# Patient Record
Sex: Male | Born: 2000 | Race: White | Hispanic: No | Marital: Single | Smoking: Current every day smoker
Health system: Southern US, Community
[De-identification: ages and names within clinical notes are randomized; demographics above are authoritative.]

## PROBLEM LIST (undated history)

## (undated) HISTORY — PX: CIRCUMCISION: SUR203

## (undated) HISTORY — PX: CARDIAC SURGERY: SHX584

---
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
## 2020-02-21 DIAGNOSIS — Z419 Encounter for procedure for purposes other than remedying health state, unspecified: Secondary | ICD-10-CM | POA: Diagnosis not present

## 2020-03-23 DIAGNOSIS — Z419 Encounter for procedure for purposes other than remedying health state, unspecified: Secondary | ICD-10-CM | POA: Diagnosis not present

## 2020-04-20 DIAGNOSIS — Z419 Encounter for procedure for purposes other than remedying health state, unspecified: Secondary | ICD-10-CM | POA: Diagnosis not present

## 2020-05-21 DIAGNOSIS — Z419 Encounter for procedure for purposes other than remedying health state, unspecified: Secondary | ICD-10-CM | POA: Diagnosis not present

## 2020-06-20 DIAGNOSIS — Z419 Encounter for procedure for purposes other than remedying health state, unspecified: Secondary | ICD-10-CM | POA: Diagnosis not present

## 2020-06-30 DIAGNOSIS — Z8781 Personal history of (healed) traumatic fracture: Secondary | ICD-10-CM | POA: Diagnosis not present

## 2020-06-30 DIAGNOSIS — M25521 Pain in right elbow: Secondary | ICD-10-CM | POA: Diagnosis not present

## 2020-06-30 DIAGNOSIS — R030 Elevated blood-pressure reading, without diagnosis of hypertension: Secondary | ICD-10-CM | POA: Diagnosis not present

## 2020-06-30 DIAGNOSIS — M25421 Effusion, right elbow: Secondary | ICD-10-CM | POA: Diagnosis not present

## 2020-07-05 DIAGNOSIS — M25421 Effusion, right elbow: Secondary | ICD-10-CM | POA: Diagnosis not present

## 2020-07-05 DIAGNOSIS — M25521 Pain in right elbow: Secondary | ICD-10-CM | POA: Diagnosis not present

## 2020-07-21 DIAGNOSIS — Z419 Encounter for procedure for purposes other than remedying health state, unspecified: Secondary | ICD-10-CM | POA: Diagnosis not present

## 2020-08-20 DIAGNOSIS — Z419 Encounter for procedure for purposes other than remedying health state, unspecified: Secondary | ICD-10-CM | POA: Diagnosis not present

## 2020-09-20 DIAGNOSIS — Z419 Encounter for procedure for purposes other than remedying health state, unspecified: Secondary | ICD-10-CM | POA: Diagnosis not present

## 2020-10-21 DIAGNOSIS — Z419 Encounter for procedure for purposes other than remedying health state, unspecified: Secondary | ICD-10-CM | POA: Diagnosis not present

## 2020-11-20 DIAGNOSIS — Z419 Encounter for procedure for purposes other than remedying health state, unspecified: Secondary | ICD-10-CM | POA: Diagnosis not present

## 2020-12-21 DIAGNOSIS — Z419 Encounter for procedure for purposes other than remedying health state, unspecified: Secondary | ICD-10-CM | POA: Diagnosis not present

## 2021-01-20 DIAGNOSIS — Z419 Encounter for procedure for purposes other than remedying health state, unspecified: Secondary | ICD-10-CM | POA: Diagnosis not present

## 2021-02-17 DIAGNOSIS — T148XXA Other injury of unspecified body region, initial encounter: Secondary | ICD-10-CM | POA: Diagnosis not present

## 2021-02-17 DIAGNOSIS — I959 Hypotension, unspecified: Secondary | ICD-10-CM | POA: Diagnosis not present

## 2021-02-17 DIAGNOSIS — M79673 Pain in unspecified foot: Secondary | ICD-10-CM | POA: Diagnosis not present

## 2021-02-17 DIAGNOSIS — F17290 Nicotine dependence, other tobacco product, uncomplicated: Secondary | ICD-10-CM | POA: Diagnosis not present

## 2021-02-17 DIAGNOSIS — M13871 Other specified arthritis, right ankle and foot: Secondary | ICD-10-CM | POA: Diagnosis not present

## 2021-02-17 DIAGNOSIS — M25571 Pain in right ankle and joints of right foot: Secondary | ICD-10-CM | POA: Diagnosis not present

## 2021-02-17 DIAGNOSIS — M064 Inflammatory polyarthropathy: Secondary | ICD-10-CM | POA: Diagnosis not present

## 2021-02-17 DIAGNOSIS — Z743 Need for continuous supervision: Secondary | ICD-10-CM | POA: Diagnosis not present

## 2021-02-20 DIAGNOSIS — Z419 Encounter for procedure for purposes other than remedying health state, unspecified: Secondary | ICD-10-CM | POA: Diagnosis not present

## 2021-03-23 DIAGNOSIS — Z419 Encounter for procedure for purposes other than remedying health state, unspecified: Secondary | ICD-10-CM | POA: Diagnosis not present

## 2021-04-20 DIAGNOSIS — Z419 Encounter for procedure for purposes other than remedying health state, unspecified: Secondary | ICD-10-CM | POA: Diagnosis not present

## 2021-05-21 DIAGNOSIS — Z419 Encounter for procedure for purposes other than remedying health state, unspecified: Secondary | ICD-10-CM | POA: Diagnosis not present

## 2021-06-20 DIAGNOSIS — Z419 Encounter for procedure for purposes other than remedying health state, unspecified: Secondary | ICD-10-CM | POA: Diagnosis not present

## 2021-08-04 ENCOUNTER — Other Ambulatory Visit: Payer: Self-pay

## 2021-08-04 ENCOUNTER — Encounter (HOSPITAL_COMMUNITY): Payer: Self-pay

## 2021-08-04 ENCOUNTER — Emergency Department (HOSPITAL_COMMUNITY): Payer: Medicaid Other

## 2021-08-04 ENCOUNTER — Emergency Department (HOSPITAL_COMMUNITY)
Admission: EM | Admit: 2021-08-04 | Discharge: 2021-08-04 | Disposition: A | Discharge status: Home / Self Care | Payer: Medicaid Other | Attending: Emergency Medicine | Admitting: Emergency Medicine

## 2021-08-04 DIAGNOSIS — M25421 Effusion, right elbow: Secondary | ICD-10-CM | POA: Diagnosis not present

## 2021-08-04 DIAGNOSIS — M79601 Pain in right arm: Secondary | ICD-10-CM | POA: Diagnosis not present

## 2021-08-04 DIAGNOSIS — M25521 Pain in right elbow: Secondary | ICD-10-CM | POA: Diagnosis not present

## 2021-08-04 MED ORDER — DICLOFENAC SODIUM 50 MG PO TBEC: 50.0000 mg | DELAYED_RELEASE_TABLET | Freq: Two times a day (BID) | ORAL | 0 refills | Status: AC

## 2021-08-04 NOTE — ED Provider Triage Note (Signed)
Emergency Medicine Provider Triage Evaluation Note  Justin Duran , a 21 y.o. male  was evaluated in triage.  Pt complains of R elbow pain. 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 He heard a pop in the elbow and is now having sever pain and difficulty extending the elbow without severe pain.  Review of Systems  Positive: R elbow pain  Negative: Redness or heat  Physical Exam  BP (!) 140/96 (BP Location: Left Arm)   Pulse 91   Temp 99.3 F (37.4 C) (Oral)   Resp 18   Ht 5\' 9"  (1.753 m)   Wt 104.3 kg   SpO2 93%   BMI 33.97 kg/m  Gen:   Awake, no distress  Resp:  Normal effort  MSK:   Moves extremities without difficulty  Other:    Medical Decision Making  Medically screening exam initiated at 3:19 AM.  Appropriate orders placed.  Justin Duran was informed that the remainder of the evaluation will be completed by another provider, this initial triage assessment does not replace that evaluation, and the importance of remaining in the ED until their evaluation is complete.  Work up Justin Duran, [place], [place] 08/04/21 [phone]
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
## 2021-08-04 NOTE — ED Provider Notes (Signed)
[facility] EMERGENCY DEPARTMENT Provider Note   CSN: [id number] Arrival date & time: 08/04/21  4854     History  Chief Complaint  Patient presents with   Arm Injury    Justin Duran is a 21 y.o. male.  21 year old male presents with complaint of right elbow pain.  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 Patient states that approximate in 2020 in which case his elbow was stuck in a flexed position for period of time for unknown reasons.  At baseline, has full range of motion of the elbow.       Home Medications Prior to Admission medications   Medication Sig Start Date End Date Taking? Authorizing Provider  diclofenac (VOLTAREN) 50 MG EC tablet Take 1 tablet (50 mg total) by mouth 2 (two) times daily for 10 days. 08/04/21 08/14/21 Yes Jeannie Fend, PA-C      Allergies    Patient has no known allergies.    Review of Systems   Review of Systems Negative except as per HPI Physical Exam Updated Vital Signs BP 134/79 (BP Location: Left Arm)   Pulse 66   Temp (!) 97.5 F (36.4 C) (Oral)   Resp 18   Ht 5\' 9"  (1.753 m)   Wt 104.3 kg   SpO2 99%   BMI 33.97 kg/m  Physical Exam Vitals and nursing note reviewed.  Constitutional:      General: He is not in acute distress.    Appearance: He is well-developed. He is not diaphoretic.  HENT:     Head: Normocephalic and atraumatic.  Cardiovascular:     Pulses: Normal pulses.  Pulmonary:     Effort: Pulmonary effort is normal.  Musculoskeletal:        General: Swelling and tenderness present. No deformity.     Comments: Right elbow held in flexion at 90 degrees, unable to supinate/pronate/further flex or extend the elbow.  Does appear to have some swelling of the right elbow, generalized tenderness, no overlying  erythema  Skin:    General: Skin is warm and dry.     Findings: No bruising, erythema or rash.  Neurological:     Mental Status: He is alert and oriented to person, place, and time.     Sensory: No sensory deficit.  Psychiatric:        Behavior: Behavior normal.     ED Results / Procedures / Treatments   Labs (all labs ordered are listed, but only abnormal results are displayed) Labs Reviewed - No data to display  EKG None  Radiology DG Elbow Complete Right  Result Date: 08/04/2021 CLINICAL DATA:  Arm pain EXAM: RIGHT ELBOW - COMPLETE 3+ VIEW COMPARISON:  None FINDINGS: Right elbow joint effusion. No visible fracture. No subluxation or dislocation. Soft tissues are intact. IMPRESSION: Right elbow joint effusion without visible fracture. Cannot exclude occult fracture. Consider immobilization and repeat imaging in 1 week if symptoms persist. Electronically Signed   By: 08/06/2021 M.D.   On: 08/04/2021 03:46    Procedures Procedures    Medications Ordered in ED Medications - No data to display  ED Course/ Medical Decision Making/ A&P  Medical Decision Making Risk Prescription drug management.   21 year old male with complaint of pain of the right elbow as above.  He is found to have generalized right elbow tenderness with mild swelling.  Doubt septic joint, no overlying erythema, no fever.  Injuries reported to have happened after 4 hours of repetitive use of the right elbow resulting in a pop sensation.  Consider tendon injury.  X-ray with effusion, possibility of occult fracture.  Patient is placed in a sling, recommend 10-day course of NSAID and follow-up with orthopedics.  Recommend patient follow-up with Worker's Comp. as this injury occurred while he was at work.  Patient states his employer does not intend to file Worker's Comp.        Final Clinical Impression(s) / ED Diagnoses Final diagnoses:  Elbow effusion, right    Rx / DC  Orders ED Discharge Orders          Ordered    diclofenac (VOLTAREN) 50 MG EC tablet  2 times daily        08/04/21 0856              Jeannie Fend, PA-C 08/04/21 1008    Arby Barrette, MD 08/04/21 1040

## 2021-08-04 NOTE — Discharge Instructions (Signed)
Wear sling.  Complete course of NSAIDs. Follow up with orthopedics. Recommend discussing work related injury with your employer.

## 2021-09-13 ENCOUNTER — Emergency Department: Payer: Medicaid Other

## 2021-09-13 ENCOUNTER — Other Ambulatory Visit: Payer: Self-pay

## 2021-09-13 ENCOUNTER — Emergency Department
Admission: EM | Admit: 2021-09-13 | Discharge: 2021-09-13 | Disposition: A | Discharge status: Home / Self Care | Payer: Medicaid Other | Attending: Student in an Organized Health Care Education/Training Program | Admitting: Student in an Organized Health Care Education/Training Program

## 2021-09-13 DIAGNOSIS — S92411D Displaced fracture of proximal phalanx of right great toe, subsequent encounter for fracture with routine healing: Secondary | ICD-10-CM | POA: Diagnosis not present

## 2021-09-13 DIAGNOSIS — M79671 Pain in right foot: Secondary | ICD-10-CM | POA: Insufficient documentation

## 2021-09-13 MED ORDER — OXYCODONE-ACETAMINOPHEN 5-325 MG PO TABS
1.0000 | ORAL_TABLET | Freq: Once | ORAL | Status: AC
  Administered 2021-09-13: 1 via ORAL
  Filled 2021-09-13: qty 1

## 2021-09-13 MED ORDER — OXYCODONE-ACETAMINOPHEN 5-325 MG PO TABS: 1.0000 | ORAL_TABLET | ORAL | 0 refills | Status: DC | PRN

## 2021-09-13 NOTE — ED Provider Notes (Signed)
[facility] Provider Note    Event Date/Time   First MD Initiated Contact with Patient 09/13/21 1052     (approximate)   History   Foot Injury   HPI  Justin Duran is a 21 y.o. male presents to the ER for evaluation of right ankle pain.  Has a chronic history of this with tight Achilles tendon that was recommended for surgery as a child that he never had and also has pes cavus deformity of the right foot.  Is having worsening pain after starting new job where he is on his feet quite a bit.  Denies any numbness or tingling no fevers.  Has been taking anti-inflammatories without improvement.     Physical Exam   Triage Vital Signs: ED Triage Vitals  Enc Vitals Group     BP 09/13/21 1041 132/82     Pulse Rate 09/13/21 1041 83     Resp 09/13/21 1040 20     Temp 09/13/21 1040 98.3 F (36.8 C)     Temp Source 09/13/21 1040 Oral     SpO2 09/13/21 1041 98 %     Weight 09/13/21 1040 240 lb (108.9 kg)     Height 09/13/21 1040 5\' 11"  (1.803 m)     Head Circumference --      Peak Flow --      Pain Score 09/13/21 1040 6     Pain Loc --      Pain Edu? --      Excl. in GC? --     Most recent vital signs: Vitals:   09/13/21 1040 09/13/21 1041  BP:  132/82  Pulse:  83  Resp: 20   Temp: 98.3 F (36.8 C)   SpO2:  98%     Constitutional: Alert  Eyes: Conjunctivae are normal.  Head: Atraumatic. Nose: No congestion/rhinnorhea. Mouth/Throat: Mucous membranes are moist.   Neck: Painless ROM.  Cardiovascular:   Good peripheral circulation. Respiratory: Normal respiratory effort.  No retractions.  Gastrointestinal: Soft and nontender.  Musculoskeletal: High arch with tenderness to palpation of the base of the Achilles tendon.  Calf compartment soft no edema neurovascular intact no joint effusion no overlying warmth or cellulitis Neurologic:  MAE spontaneously. No gross focal neurologic deficits are appreciated.  Skin:  Skin is warm, dry and intact.  No rash noted. Psychiatric: Mood and affect are normal. Speech and behavior are normal.    ED Results / Procedures / Treatments   Labs (all labs ordered are listed, but only abnormal results are displayed) Labs Reviewed - No data to display   EKG     RADIOLOGY Please see ED Course for my review and interpretation.  I personally reviewed all radiographic images ordered to evaluate for the above acute complaints and reviewed radiology reports and findings.  These findings were personally discussed with the patient.  Please see medical record for radiology report.     Procedures   MEDICATIONS ORDERED IN ED: Medications  oxyCODONE-acetaminophen (PERCOCET/ROXICET) 5-325 MG per tablet 1 tablet (1 tablet Oral Given 09/13/21 1115)     IMPRESSION / MDM / ASSESSMENT AND PLAN / ED COURSE  I reviewed the triage vital signs and the nursing notes.                              Differential diagnosis includes, but is not limited to, fracture, contusion, tendinitis,  This patient presented to the ER  for evaluation of right ankle pain as described above.  Seems consistent with Achilles tendinitis but given duration and severity of pain will give pain medication will order x-ray.  Patient has crutches at home as well as brace for similar episode in the past.  He has follow-up with podiatry this coming week.   Clinical Course as of 09/13/21 1153  Tue Sep 13, 2021  1127 X-ray on my review and interpretation does not show any evidence of fracture or lytic lesion. [PR]    Clinical Course User Index [PR] Willy Eddy, MD   X-ray with no acute deformity.  Pain improved with oral medication.  On review of PMP will give short prescription of pain medication.  Is not consistent with infectious process.  Not consistent with DVT.  Does appear appropriate for outpatient follow-up.   FINAL CLINICAL IMPRESSION(S) / ED DIAGNOSES   Final diagnoses:  Right foot pain     Rx / DC Orders    ED Discharge Orders          Ordered    oxyCODONE-acetaminophen (PERCOCET) 5-325 MG tablet  Every 4 hours PRN        09/13/21 1149             Note:  This document was prepared using Dragon voice recognition software and may include unintentional dictation errors.    Willy Eddy, MD 09/13/21 1153

## 2021-09-13 NOTE — ED Triage Notes (Signed)
Pt here with right foot pain. Pt states his achilles tendon is tight all the time but the pain has gotten worse over time. Pt states he was born with this and he should have gotten surgery when he was a child but now he states he cannot walk without limping.

## 2021-09-13 NOTE — Discharge Instructions (Addendum)
Follow up with podiatry clinic.    FINDINGS: Interval healing of the prior medial proximal aspect of the proximal phalanx of the great toe fracture. Normal alignment.  There is again moderate to high-grade pes cavus. There is again mild varus angulation of the tarsometatarsal joints at the midfoot. New mild cystic change on both sides of the great toe interphalangeal joint likely degenerative.  No acute fracture or dislocation.  IMPRESSION: 1. Interval healing of prior proximal aspect of the proximal phalanx of the great toe fracture. 2. Moderate to high-grade pes cavus.

## 2021-09-20 ENCOUNTER — Ambulatory Visit (INDEPENDENT_AMBULATORY_CARE_PROVIDER_SITE_OTHER): Payer: Medicaid Other | Admitting: Podiatry

## 2021-09-20 ENCOUNTER — Ambulatory Visit (INDEPENDENT_AMBULATORY_CARE_PROVIDER_SITE_OTHER): Payer: Medicaid Other

## 2021-09-20 DIAGNOSIS — M7661 Achilles tendinitis, right leg: Secondary | ICD-10-CM

## 2021-09-20 DIAGNOSIS — M24573 Contracture, unspecified ankle: Secondary | ICD-10-CM

## 2021-09-20 DIAGNOSIS — M7662 Achilles tendinitis, left leg: Secondary | ICD-10-CM | POA: Diagnosis not present

## 2021-09-20 DIAGNOSIS — Q6671 Congenital pes cavus, right foot: Secondary | ICD-10-CM

## 2021-09-20 MED ORDER — MELOXICAM 15 MG PO TABS: 15.0000 mg | ORAL_TABLET | Freq: Every day | ORAL | 3 refills | Status: DC

## 2021-09-25 ENCOUNTER — Encounter: Payer: Self-pay | Admitting: Podiatry

## 2021-09-25 NOTE — Progress Notes (Signed)
  Subjective:  Patient ID: Justin Duran, male    DOB: Sep 11, 2000,  MRN: [id number]  Chief Complaint  Patient presents with   Foot Pain    R ft pain  in achilles tendon, X2 MONTHS, PAIN IS CONSTANT, 7/8 PAIN LEVEL, WALKING/ STANDING TOO LONG/ DAILY ACTIVITIES TRIGGER PAIN, PAIN LOCATED IN THE HEEL     21 y.o. male presents with the above complaint. History confirmed with patient.   Objective:  Physical Exam: warm, good capillary refill, no trophic changes or ulcerative lesions, normal DP and PT pulses, normal sensory exam, and significant pes cavus deformity, he has pain at the Achilles tendon, significant equinus   Radiographs: Multiple views x-ray of the right foot: no fracture, dislocation, swelling or degenerative changes noted and significant pes cavus deformity Assessment:   1. Congenital pes cavus, right foot   2. Equinus contracture of ankle   3. Achilles tendinitis of both lower extremities      Plan:  Patient was evaluated and treated and all questions answered.  Discussed the etiology and treatment options for Achilles tendinitis including stretching, formal physical therapy with an eccentric exercises therapy plan, supportive shoegears such as a running shoe or sneaker, heel lifts, topical and oral medications.  We also discussed that I do not routinely perform injections in this area because of the risk of an increased damage or rupture of the tendon.  We also discussed the role of surgical treatment of this for patients who do not improve after exhausting non-surgical treatment options.  -XR reviewed with patient -Educated on stretching and icing of the affected limb. -Night splint recommended and Rx was given to him to get this from [facility] or he can get it online. -Referral placed to physical therapy. -Rx for Meloxicam. Advised on risks, benefits, and alternatives of the medication   Return for re-check Achilles tendon.

## 2021-10-13 DIAGNOSIS — M7661 Achilles tendinitis, right leg: Secondary | ICD-10-CM | POA: Diagnosis not present

## 2021-10-13 DIAGNOSIS — M24571 Contracture, right ankle: Secondary | ICD-10-CM | POA: Diagnosis not present

## 2021-10-17 NOTE — Therapy (Signed)
OUTPATIENT PHYSICAL THERAPY LOWER EXTREMITY EVALUATION   Patient Name: Justin Duran MRN: [id number] DOB:04-16-2000, 21 y.o., male Today's Date: 10/18/2021   PT End of Session - 10/18/21 1651     Visit Number 1    Number of Visits 6    Date for PT Re-Evaluation 12/18/21    Authorization Type [place] [employer]    PT Start Time 1615    PT Stop Time 1700    PT Time Calculation (min) 45 min    Activity Tolerance Patient tolerated treatment well;Patient limited by pain    Behavior During Therapy [facility] for tasks assessed/performed             History reviewed. No pertinent past medical history. 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    PCP: [facility]  REFERRING PROVIDER: Edwin Cap, DPM  REFERRING DIAG:  [phone] (ICD-10-CM) - Equinus contracture of ankle  M76.61,M76.62 (ICD-10-CM) - Achilles tendinitis of both lower extremities    THERAPY DIAG: Achilles tendinitis of both lower extremities   Rationale for Evaluation and Treatment Rehabilitation  ONSET DATE: 3 months  SUBJECTIVE:   SUBJECTIVE STATEMENT: Describes a 2 year history of R achilles pain, has been issued a night splint which he has been wearing  PERTINENT HISTORY:  Foot Pain      R ft pain  in achilles tendon, X2 MONTHS, PAIN IS CONSTANT, 7/8 PAIN LEVEL, WALKING/ STANDING TOO LONG/ DAILY ACTIVITIES TRIGGER PAIN, PAIN LOCATED IN THE HEEL       21 y.o. male presents with the above complaint. History confirmed with patient.   PAIN:  Are you having pain? Yes: NPRS scale: 10/10 Pain location: R achilles  Pain description: ache Aggravating factors: weightbearing tasks Relieving factors: rest  PRECAUTIONS: None  WEIGHT BEARING RESTRICTIONS No  FALLS:   Has patient fallen in last 6 months? No  LIVING ENVIRONMENT: Lives with: lives with their family Lives in: House/apartment Stairs:  yes Has following equipment at home: None  OCCUPATION: lumbar yard  PLOF: Independent  PATIENT GOALS To relieve and manage my symptoms   OBJECTIVE:   DIAGNOSTIC FINDINGS: Radiographs: Multiple views x-ray of the right foot: no fracture, dislocation, swelling or degenerative changes noted and significant pes cavus deformity  PATIENT SURVEYS:  LEFS 20/80  COGNITION:  Overall cognitive status: Within functional limits for tasks assessed     SENSATION: Not tested   MUSCLE LENGTH: Hamstrings: Restrictions R vs L, 40d R, 60d L   POSTURE:  pes cavus   PALPATION: Tender to R achilles insertion  LOWER EXTREMITY ROM:  A/PROM Right eval Left eval  Hip flexion    Hip extension    Hip abduction    Hip adduction    Hip internal rotation    Hip external rotation    Knee flexion    Knee extension    Ankle dorsiflexion -4/4d   Ankle plantarflexion    Ankle inversion 10/20d   Ankle eversion 6/8d    (Blank rows = not tested)  LOWER EXTREMITY MMT:  MMT Right eval Left eval  Hip flexion    Hip extension    Hip abduction    Hip adduction    Hip internal rotation  Hip external rotation    Knee flexion    Knee extension    Ankle dorsiflexion    Ankle plantarflexion 2+ 4  Ankle inversion    Ankle eversion     (Blank rows = not tested)  LOWER EXTREMITY SPECIAL TESTS:  N/A  FUNCTIONAL TESTS:  5 times sit to stand: 13s arms crossed   GAIT: Distance walked: 28ft x2 Assistive device utilized: None Level of assistance: Complete Independence Comments: antalgic gait due to pain and soft tissue restrictions    TODAY'S TREATMENT: Eval    PATIENT EDUCATION:  Education details: Discussed eval findings, rehab rationale and POC and patient is in agreement  Person educated: Patient Education method: Explanation Education  comprehension: verbalized understanding and needs further education   HOME EXERCISE PROGRAM: TBD  ASSESSMENT:  CLINICAL IMPRESSION: Patient is a 21 y.o. male who was seen today for physical therapy evaluation and treatment for R ankle/foot pain due to congenital pes cavus deformity resulting in a high arch and shortened achilles. Previous PT has been ineffective and patient currently using night splint.  Weakness and ROM restrictions identified in R ankle.   OBJECTIVE IMPAIRMENTS Abnormal gait, decreased activity tolerance, decreased balance, decreased coordination, decreased endurance, decreased knowledge of condition, decreased mobility, difficulty walking, decreased ROM, decreased strength, impaired flexibility, postural dysfunction, and pain.   ACTIVITY LIMITATIONS squatting and stairs  PERSONAL FACTORS Fitness, Past/current experiences, Time since onset of injury/illness/exacerbation, and 1 comorbidity: equinus deformity  are also affecting patient's functional outcome.   REHAB POTENTIAL: Fair based on chronicity, congenital nature and prior conservative treatment  CLINICAL DECISION MAKING: Stable/uncomplicated  EVALUATION COMPLEXITY: Low   GOALS: Goals reviewed with patient? Yes  SHORT TERM GOALS: Target date: 11/08/2021  I in HEP Baseline: TBD Goal status: INITIAL  2.  Decrease worst pain to 7/10 Baseline: 10/10 Goal status: INITIAL    LONG TERM GOALS: Target date: 11/29/2021   Increase LEFS score to 30/80 Baseline: 20/80 Goal status: INITIAL  2.  5d AROM DF Baseline: -4d Goal status: INITIAL  3.  3/5 R DF strength Baseline: 2+/5 R DF strength Goal status: INITIAL    PLAN: PT FREQUENCY: 1x/week  PT DURATION: 6 weeks  PLANNED INTERVENTIONS: Therapeutic exercises, Therapeutic activity, Neuromuscular re-education, Balance training, Gait training, Patient/Family education, Self Care, Joint mobilization, Stair training, Dry Needling, Splintting, Taping,  Ionotophoresis 4mg /ml Dexamethasone, Manual therapy, and Re-evaluation  PLAN FOR NEXT SESSION: HEP establishment, ionto, aerobic work, stretching, strengthening, balance and proprioceptive work.    , PT 10/18/2021, 4:52 PM   Check all possible CPT codes: 10/20/2021 - PT Re-evaluation, 97110- Therapeutic Exercise, [phone]- Neuro Re-education, [phone] - Gait Training, [phone] - Manual Therapy, [phone] - Therapeutic Activities, [phone] - Self Care, and [phone] - Iontophoresis     If treatment provided at initial evaluation, no treatment charged due to lack of authorization.

## 2021-10-18 ENCOUNTER — Ambulatory Visit: Payer: Medicaid Other | Attending: Podiatry

## 2021-10-18 DIAGNOSIS — M24573 Contracture, unspecified ankle: Secondary | ICD-10-CM | POA: Insufficient documentation

## 2021-10-18 DIAGNOSIS — M6281 Muscle weakness (generalized): Secondary | ICD-10-CM | POA: Diagnosis not present

## 2021-10-18 DIAGNOSIS — R2689 Other abnormalities of gait and mobility: Secondary | ICD-10-CM | POA: Insufficient documentation

## 2021-10-18 DIAGNOSIS — M7662 Achilles tendinitis, left leg: Secondary | ICD-10-CM | POA: Insufficient documentation

## 2021-10-18 DIAGNOSIS — M7661 Achilles tendinitis, right leg: Secondary | ICD-10-CM | POA: Insufficient documentation

## 2021-10-21 DIAGNOSIS — Z419 Encounter for procedure for purposes other than remedying health state, unspecified: Secondary | ICD-10-CM | POA: Diagnosis not present

## 2021-10-25 NOTE — Therapy (Unsigned)
OUTPATIENT PHYSICAL THERAPY TREATMENT NOTE   Patient Name: Justin Duran MRN: [id number] DOB:2000/10/08, 21 y.o., male Today's Date: 10/26/2021  PCP: [facility] REFERRING PROVIDER: Criselda Peaches, DPM  END OF SESSION:   PT End of Session - 10/26/21 1134     Visit Number 2    Number of Visits 6    Date for PT Re-Evaluation 12/18/21    Authorization Type [place] [employer]    PT Start Time 1130    PT Stop Time 1210    PT Time Calculation (min) 40 min    Activity Tolerance Patient tolerated treatment well;Patient limited by pain    Behavior During Therapy [facility] for tasks assessed/performed             History reviewed. No pertinent past medical history. 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    REFERRING DIAG:  M24.573 (ICD-10-CM) - Equinus contracture of ankle  M76.61,M76.62 (ICD-10-CM) - Achilles tendinitis of both lower extremities      THERAPY DIAG: Achilles tendinitis of both lower extremities  Rationale for Evaluation and Treatment Rehabilitation  PERTINENT HISTORY:  Foot Pain       R ft pain  in achilles tendon, X2 MONTHS, PAIN IS CONSTANT, 7/8 PAIN LEVEL, WALKING/ STANDING TOO LONG/ DAILY ACTIVITIES TRIGGER PAIN, PAIN LOCATED IN THE HEEL     PRECAUTIONS: none  SUBJECTIVE: Unchanging symptoms, has concerns over missed work and will reach out to MD for work restriction recommendations  PAIN:  Are you having pain? Yes: NPRS scale: 8/10 Pain location: R foot Pain description: sharp Aggravating factors: weightbearing and walking Relieving factors: rest       OBJECTIVE: (objective measures completed at initial evaluation unless otherwise dated)   DIAGNOSTIC FINDINGS: Radiographs: Multiple views x-ray of the  right foot: no fracture, dislocation, swelling or degenerative changes noted and significant pes cavus deformity   PATIENT SURVEYS:  LEFS 20/80   COGNITION:           Overall cognitive status: Within functional limits for tasks assessed                          SENSATION: Not tested     MUSCLE LENGTH: Hamstrings: Restrictions R vs L, 40d R, 60d L     POSTURE:  pes cavus    PALPATION: Tender to R achilles insertion   LOWER EXTREMITY ROM:   A/PROM Right eval Left eval  Hip flexion      Hip extension      Hip abduction      Hip adduction      Hip internal rotation      Hip external rotation      Knee flexion      Knee extension      Ankle dorsiflexion -4/4d    Ankle plantarflexion      Ankle inversion 10/20d    Ankle eversion 6/8d     (Blank rows = not tested)   LOWER EXTREMITY MMT:   MMT Right eval Left eval  Hip flexion      Hip extension      Hip abduction      Hip adduction  Hip internal rotation      Hip external rotation      Knee flexion      Knee extension      Ankle dorsiflexion      Ankle plantarflexion 2+ 4  Ankle inversion      Ankle eversion       (Blank rows = not tested)   LOWER EXTREMITY SPECIAL TESTS:  N/A   FUNCTIONAL TESTS:  5 times sit to stand: 13s arms crossed    GAIT: Distance walked: 60ft x2 Assistive device utilized: None Level of assistance: Complete Independence Comments: antalgic gait due to pain and soft tissue restrictions       TODAY'S TREATMENT: [facility] Adult PT Treatment:                                                DATE: 10/26/21 Therapeutic Exercise: Nustep L4 8 min PF stretch w/towel 30s x3 R hamstring stretch 30s x3 R QS's 3s hold 15x R SLR 15x R heel slides 15x R hip abduction 15x Bridge 15x Ankle INV/EV/DF against YTB 15x each in long sit Great toe extension R 15x R foot digit extension 15x      PATIENT EDUCATION:  Education details: Discussed eval findings, rehab rationale and POC and  patient is in agreement  Person educated: Patient Education method: Explanation Education comprehension: verbalized understanding and needs further education     HOME EXERCISE PROGRAM: Access Code: [email or web address] URL: [email or web address] Date: 10/26/2021 Prepared by: Gustavus Bryant  Exercises - Long Sitting Plantar Fascia Stretch with Towel  - 2 x daily - 7 x weekly - 1 sets - 3 reps - 30s hold - Seated Table Hamstring Stretch  - 2 x daily - 7 x weekly - 1 sets - 3 reps - 30s hold - Supine Quad Set  - 2 x daily - 7 x weekly - 1 sets - 15 reps - 3s hold - Small Range Straight Leg Raise  - 2 x daily - 7 x weekly - 1 sets - 15 reps - 3s hold   ASSESSMENT:   CLINICAL IMPRESSION: Unchanging symptoms, has resorted to a cane for mobility and is missing work due to pain.  Today 's session established a HEP for stretch and strength of  RLE, encourages WB tasks as tolerated, HEP handout issued.  Added "toe yoga" with difficulty isolating digits vs great toe.     OBJECTIVE IMPAIRMENTS Abnormal gait, decreased activity tolerance, decreased balance, decreased coordination, decreased endurance, decreased knowledge of condition, decreased mobility, difficulty walking, decreased ROM, decreased strength, impaired flexibility, postural dysfunction, and pain.    ACTIVITY LIMITATIONS squatting and stairs   PERSONAL FACTORS Fitness, Past/current experiences, Time since onset of injury/illness/exacerbation, and 1 comorbidity: equinus deformity  are also affecting patient's functional outcome.    REHAB POTENTIAL: Fair based on chronicity, congenital nature and prior conservative treatment   CLINICAL DECISION MAKING: Stable/uncomplicated   EVALUATION COMPLEXITY: Low     GOALS: Goals reviewed with patient? Yes   SHORT TERM GOALS: Target date: 11/08/2021  I in HEP Baseline: [id number] Goal status: INITIAL   2.  Decrease worst pain to 7/10 Baseline: 10/10 Goal status: INITIAL        LONG TERM GOALS: Target date: 11/29/2021    Increase LEFS score to 30/80 Baseline: 20/80 Goal status: INITIAL   2.  5d AROM DF  Baseline: -4d Goal status: INITIAL   3.  3/5 R DF strength Baseline: 2+/5 R DF strength Goal status: INITIAL       PLAN: PT FREQUENCY: 1x/week   PT DURATION: 6 weeks   PLANNED INTERVENTIONS: Therapeutic exercises, Therapeutic activity, Neuromuscular re-education, Balance training, Gait training, Patient/Family education, Self Care, Joint mobilization, Stair training, Dry Needling, Splintting, Taping, Ionotophoresis 4mg /ml Dexamethasone, Manual therapy, and Re-evaluation   PLAN FOR NEXT SESSION: HEP establishment, ionto, aerobic work, stretching, strengthening, balance and proprioceptive work.  Advance to WB tasks as tolerated    , PT 10/26/2021, 12:13 PM

## 2021-10-26 ENCOUNTER — Ambulatory Visit: Payer: Medicaid Other | Attending: Podiatry

## 2021-10-26 ENCOUNTER — Encounter: Payer: Self-pay | Admitting: Podiatry

## 2021-10-26 DIAGNOSIS — M7662 Achilles tendinitis, left leg: Secondary | ICD-10-CM | POA: Insufficient documentation

## 2021-10-26 DIAGNOSIS — R2689 Other abnormalities of gait and mobility: Secondary | ICD-10-CM | POA: Diagnosis not present

## 2021-10-26 DIAGNOSIS — M6281 Muscle weakness (generalized): Secondary | ICD-10-CM

## 2021-10-26 DIAGNOSIS — M7661 Achilles tendinitis, right leg: Secondary | ICD-10-CM

## 2021-11-02 ENCOUNTER — Ambulatory Visit: Payer: Medicaid Other

## 2021-11-02 DIAGNOSIS — R2689 Other abnormalities of gait and mobility: Secondary | ICD-10-CM

## 2021-11-02 DIAGNOSIS — M7661 Achilles tendinitis, right leg: Secondary | ICD-10-CM

## 2021-11-02 DIAGNOSIS — M7662 Achilles tendinitis, left leg: Secondary | ICD-10-CM | POA: Diagnosis not present

## 2021-11-02 DIAGNOSIS — M6281 Muscle weakness (generalized): Secondary | ICD-10-CM

## 2021-11-02 NOTE — Therapy (Signed)
OUTPATIENT PHYSICAL THERAPY TREATMENT NOTE   Patient Name: Justin Duran MRN: [id number] DOB:11-11-2000, 21 y.o., male Today's Date: 11/02/2021  PCP: [facility] REFERRING PROVIDER: Criselda Peaches, DPM  END OF SESSION:   PT End of Session - 11/02/21 1121     Visit Number 3    Number of Visits 6    Date for PT Re-Evaluation 12/18/21    Authorization Type [place] MCD    Authorization Time Period 8 visits approved 10/19/21-12/17/21    Authorization - Visit Number 2    Authorization - Number of Visits 10    PT Start Time 1130    PT Stop Time 1208    PT Time Calculation (min) 38 min    Activity Tolerance Patient tolerated treatment well;Patient limited by pain    Behavior During Therapy [facility] for tasks assessed/performed              History reviewed. No pertinent past medical history. 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    REFERRING DIAG:  M24.573 (ICD-10-CM) - Equinus contracture of ankle  M76.61,M76.62 (ICD-10-CM) - Achilles tendinitis of both lower extremities      THERAPY DIAG: Achilles tendinitis of both lower extremities  Rationale for Evaluation and Treatment Rehabilitation  PERTINENT HISTORY:  Foot Pain       R ft pain  in achilles tendon, X2 MONTHS, PAIN IS CONSTANT, 7/8 PAIN LEVEL, WALKING/ STANDING TOO LONG/ DAILY ACTIVITIES TRIGGER PAIN, PAIN LOCATED IN THE HEEL     PRECAUTIONS: none  SUBJECTIVE: Patient reports that his pain in better today, but that it was very bad yesterday and had to miss work.  PAIN:  Are you having pain? Yes: NPRS scale: 2/10 Pain location: R foot Pain description: sharp Aggravating factors: weightbearing and walking Relieving factors: rest      OBJECTIVE: (objective  measures completed at initial evaluation unless otherwise dated)   DIAGNOSTIC FINDINGS: Radiographs: Multiple views x-ray of the right foot: no fracture, dislocation, swelling or degenerative changes noted and significant pes cavus deformity   PATIENT SURVEYS:  LEFS 20/80   COGNITION:           Overall cognitive status: Within functional limits for tasks assessed                          SENSATION: Not tested     MUSCLE LENGTH: Hamstrings: Restrictions R vs L, 40d R, 60d L     POSTURE:  pes cavus    PALPATION: Tender to R achilles insertion   LOWER EXTREMITY ROM:   A/PROM Right eval Left eval  Hip flexion      Hip extension      Hip abduction      Hip adduction      Hip internal rotation      Hip external rotation      Knee flexion      Knee extension      Ankle dorsiflexion -4/4d    Ankle plantarflexion      Ankle inversion 10/20d    Ankle eversion 6/8d     (Blank rows = not tested)   LOWER EXTREMITY MMT:   MMT Right  eval Left eval  Hip flexion      Hip extension      Hip abduction      Hip adduction      Hip internal rotation      Hip external rotation      Knee flexion      Knee extension      Ankle dorsiflexion      Ankle plantarflexion 2+ 4  Ankle inversion      Ankle eversion       (Blank rows = not tested)   LOWER EXTREMITY SPECIAL TESTS:  N/A   FUNCTIONAL TESTS:  5 times sit to stand: 13s arms crossed    GAIT: Distance walked: 13ft x2 Assistive device utilized: None Level of assistance: Complete Independence Comments: antalgic gait due to pain and soft tissue restrictions       TODAY'S TREATMENT: [facility] Adult PT Treatment:                                                DATE: 11/02/2021 Therapeutic Exercise: Nustep L5 5 min R hamstring stretch 30s x3 R QS's 3s hold 15x R SLR 2x10 R heel slides 15x R hip abduction 2x10 Slant board stretch 2x1' Bridge 2x (hamstring cramp) Great toe extension R 15x R foot digit extension  15x Standing toe raises 2x10 Seated towel scrunch 2x1' NMRE: Rocker board  PF/DF 2x30" Romberg stance on Airex EC 2x30" (occasional UE support)  [facility] Adult PT Treatment:                                                DATE: 10/26/21 Therapeutic Exercise: Nustep L4 8 min PF stretch w/towel 30s x3 R hamstring stretch 30s x3 R QS's 3s hold 15x R SLR 15x R heel slides 15x R hip abduction 15x Bridge 15x Ankle INV/EV/DF against YTB 15x each in long sit Great toe extension R 15x R foot digit extension 15x     PATIENT EDUCATION:  Education details: Discussed eval findings, rehab rationale and POC and patient is in agreement  Person educated: Patient Education method: Explanation Education comprehension: verbalized understanding and needs further education     HOME EXERCISE PROGRAM: Access Code: [email or web address] URL: [email or web address] Date: 10/26/2021 Prepared by: Gustavus Bryant  Exercises - Long Sitting Plantar Fascia Stretch with Towel  - 2 x daily - 7 x weekly - 1 sets - 3 reps - 30s hold - Seated Table Hamstring Stretch  - 2 x daily - 7 x weekly - 1 sets - 3 reps - 30s hold - Supine Quad Set  - 2 x daily - 7 x weekly - 1 sets - 15 reps - 3s hold - Small Range Straight Leg Raise  - 2 x daily - 7 x weekly - 1 sets - 15 reps - 3s hold   ASSESSMENT:   CLINICAL IMPRESSION: Patient presents to PT with continued pain, though lessened today, and reports that he had to miss work yesterday due to increased pain. He ambulates today without cane, but states he used it yesterday when his pain was higher. Introduced standing exercises and balance training today. He had an increase in pain with toe raises but no increase in pain with balance tasks.  He still displays difficulty with "toe yoga" unable to lift 4 digits separately from great toe. Patient continues to benefit from skilled PT services and should be progressed as able to improve functional independence.     OBJECTIVE  IMPAIRMENTS Abnormal gait, decreased activity tolerance, decreased balance, decreased coordination, decreased endurance, decreased knowledge of condition, decreased mobility, difficulty walking, decreased ROM, decreased strength, impaired flexibility, postural dysfunction, and pain.    ACTIVITY LIMITATIONS squatting and stairs   PERSONAL FACTORS Fitness, Past/current experiences, Time since onset of injury/illness/exacerbation, and 1 comorbidity: equinus deformity  are also affecting patient's functional outcome.    REHAB POTENTIAL: Fair based on chronicity, congenital nature and prior conservative treatment   CLINICAL DECISION MAKING: Stable/uncomplicated   EVALUATION COMPLEXITY: Low     GOALS: Goals reviewed with patient? Yes   SHORT TERM GOALS: Target date: 11/08/2021  I in HEP Baseline: [id number] Goal status: INITIAL   2.  Decrease worst pain to 7/10 Baseline: 10/10 Goal status: INITIAL       LONG TERM GOALS: Target date: 11/29/2021    Increase LEFS score to 30/80 Baseline: 20/80 Goal status: INITIAL   2.  5d AROM DF Baseline: -4d Goal status: INITIAL   3.  3/5 R DF strength Baseline: 2+/5 R DF strength Goal status: INITIAL       PLAN: PT FREQUENCY: 1x/week   PT DURATION: 6 weeks   PLANNED INTERVENTIONS: Therapeutic exercises, Therapeutic activity, Neuromuscular re-education, Balance training, Gait training, Patient/Family education, Self Care, Joint mobilization, Stair training, Dry Needling, Splintting, Taping, Ionotophoresis 4mg /ml Dexamethasone, Manual therapy, and Re-evaluation   PLAN FOR NEXT SESSION: HEP establishment, ionto, aerobic work, stretching, strengthening, balance and proprioceptive work.  Advance to WB tasks as tolerated    , PTA 11/02/2021, 12:08 PM

## 2021-11-09 ENCOUNTER — Ambulatory Visit: Payer: Medicaid Other

## 2021-11-09 DIAGNOSIS — M7661 Achilles tendinitis, right leg: Secondary | ICD-10-CM

## 2021-11-09 DIAGNOSIS — M6281 Muscle weakness (generalized): Secondary | ICD-10-CM

## 2021-11-09 DIAGNOSIS — R2689 Other abnormalities of gait and mobility: Secondary | ICD-10-CM

## 2021-11-09 DIAGNOSIS — M7662 Achilles tendinitis, left leg: Secondary | ICD-10-CM | POA: Diagnosis not present

## 2021-11-09 NOTE — Therapy (Addendum)
OUTPATIENT PHYSICAL THERAPY TREATMENT NOTE/DC SUMMARY   Patient Name: Justin Duran MRN: [id number] DOB:2000/08/15, 21 y.o., male Today's Date: 12/28/2021  PCP: [facility] REFERRING PROVIDER: Criselda Peaches, DPM PHYSICAL THERAPY DISCHARGE SUMMARY  Visits from Start of Care: 4  Current functional level related to goals / functional outcomes: Goals partially met   Remaining deficits: Pain and decreased mobility   Education / Equipment: HEP   Patient agrees to discharge. Patient goals were partially met. Patient is being discharged due to not returning since the last visit.  END OF SESSION:      History reviewed. No pertinent past medical history. 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  PAIN:  Are you having pain? Yes: NPRS scale: 2/10 Pain location: R foot Pain description: sharp Aggravating factors: weightbearing and walking Relieving factors: rest      OBJECTIVE: (objective measures completed at initial evaluation unless otherwise dated)   DIAGNOSTIC FINDINGS: Radiographs: Multiple views x-ray of the right foot: no fracture, dislocation, swelling or degenerative  changes noted and significant pes cavus deformity   PATIENT SURVEYS:  LEFS 20/80   COGNITION:           Overall cognitive status: Within functional limits for tasks assessed                          SENSATION: Not tested     MUSCLE LENGTH: Hamstrings: Restrictions R vs L, 40d R, 60d L     POSTURE:  pes cavus    PALPATION: Tender to R achilles insertion   LOWER EXTREMITY ROM:   A/PROM Right eval Left eval  Hip flexion      Hip extension      Hip abduction      Hip adduction      Hip internal rotation      Hip external rotation      Knee flexion      Knee extension      Ankle dorsiflexion -4/4d    Ankle plantarflexion      Ankle inversion 10/20d    Ankle eversion 6/8d     (Blank rows = not tested)   LOWER EXTREMITY MMT:   MMT Right eval Left eval  Hip flexion      Hip extension      Hip abduction      Hip adduction      Hip internal rotation      Hip external rotation      Knee flexion      Knee extension      Ankle dorsiflexion      Ankle  plantarflexion 2+ 4  Ankle inversion      Ankle eversion       (Blank rows = not tested)   LOWER EXTREMITY SPECIAL TESTS:  N/A   FUNCTIONAL TESTS:  5 times sit to stand: 13s arms crossed    GAIT: Distance walked: 14f x2 Assistive device utilized: None Level of assistance: Complete Independence Comments: antalgic gait due to pain and soft tissue restrictions       TODAY'S TREATMENT: [facility] Adult PT Treatment:                                                DATE: 11/09/21 Therapeutic Exercise: Nustep L4 8 min R hamstring stretch 30s x3 R PF stretch 30 sx3 Side lie R eversion 2# 15x2 Side lie R inversion 2# 15x2 R QS's 3s hold 15x R SLR 2# 15x R hip abduction 2# 15x PF against wall 15x B, 15x unilateral SLS 30s B  [facility] Adult PT Treatment:                                                DATE: 11/02/2021 Therapeutic Exercise: Nustep L5 5 min R hamstring stretch 30s x3 R QS's 3s hold 15x R SLR 2x10 R  heel slides 15x R hip abduction 2x10 Slant board stretch 2x1' Bridge 2x (hamstring cramp) Great toe extension R 15x R foot digit extension 15x Standing toe raises 2x10 Seated towel scrunch 2x1' NMRE: Rocker board  PF/DF 2x30" Romberg stance on Airex EC 2x30" (occasional UE support)  [facility] Adult PT Treatment:                                                DATE: 10/26/21 Therapeutic Exercise: Nustep L4 8 min PF stretch w/towel 30s x3 R hamstring stretch 30s x3 R QS's 3s hold 15x R SLR 15x R heel slides 15x R hip abduction 15x Bridge 15x Ankle INV/EV/DF against YTB 15x each in long sit Great toe extension R 15x R foot digit extension 15x     PATIENT EDUCATION:  Education details: Discussed eval findings, rehab rationale and POC and patient is in agreement  Person educated: Patient Education method: Explanation Education comprehension: verbalized understanding and needs further education     HOME EXERCISE PROGRAM: Access Code: [email or web address]: [email or web address] Date: 10/26/2021 Prepared by: JSharlynn Oliphant Exercises - Long Sitting Plantar Fascia Stretch with Towel  - 2 x daily - 7 x weekly - 1 sets - 3 reps - 30s hold - Seated Table Hamstring Stretch  - 2 x daily - 7 x weekly - 1 sets - 3 reps - 30s hold - Supine Quad Set  - 2 x daily - 7 x weekly - 1 sets - 15 reps - 3s hold - Small Range Straight Leg Raise  - 2 x daily - 7 x weekly - 1 sets - 15 reps - 3s hold   ASSESSMENT:   CLINICAL IMPRESSION: Continued pain through R arch and foot, unable to tolerate consecutive days at work due to soft tissue irritation. Equinus deformity evident resulting in postural changes and  need of compensatory gait to accommodate pain.  Patient to f/u with MD on 10/25 and will discuss STD options.    OBJECTIVE IMPAIRMENTS Abnormal gait, decreased activity tolerance, decreased balance, decreased coordination, decreased endurance, decreased knowledge of condition, decreased  mobility, difficulty walking, decreased ROM, decreased strength, impaired flexibility, postural dysfunction, and pain.    ACTIVITY LIMITATIONS squatting and stairs   PERSONAL FACTORS Fitness, Past/current experiences, Time since onset of injury/illness/exacerbation, and 1 comorbidity: equinus deformity  are also affecting patient's functional outcome.    REHAB POTENTIAL: Fair based on chronicity, congenital nature and prior conservative treatment   CLINICAL DECISION MAKING: Stable/uncomplicated   EVALUATION COMPLEXITY: Low     GOALS: Goals reviewed with patient? Yes   SHORT TERM GOALS: Target date: 11/08/2021  I in HEP Baseline: [id number] Goal status: met   2.  Decrease worst pain to 7/10 Baseline: 7-9/10 Goal status: Ongoing       LONG TERM GOALS: Target date: 11/29/2021    Increase LEFS score to 30/80 Baseline: 20/80 Goal status: INITIAL   2.  5d AROM DF Baseline: -4d; 11/09/21 2d A/PROM Goal status: INITIAL   3.  3/5 R DF strength Baseline: 2+/5 R DF strength; 3-/5 strength within available ROM Goal status: INITIAL       PLAN: PT FREQUENCY: 1x/week   PT DURATION: 6 weeks   PLANNED INTERVENTIONS: Therapeutic exercises, Therapeutic activity, Neuromuscular re-education, Balance training, Gait training, Patient/Family education, Self Care, Joint mobilization, Stair training, Dry Needling, Splintting, Taping, Ionotophoresis 42m/ml Dexamethasone, Manual therapy, and Re-evaluation   PLAN FOR NEXT SESSION: HEP establishment, ionto, aerobic work, stretching, strengthening, balance and proprioceptive work.  Advance to WB tasks as tolerated    JLanice Shirts PT 12/28/2021, 6:04 PM

## 2021-11-14 ENCOUNTER — Ambulatory Visit (INDEPENDENT_AMBULATORY_CARE_PROVIDER_SITE_OTHER): Payer: Medicaid Other | Admitting: Podiatry

## 2021-11-14 ENCOUNTER — Encounter: Payer: Self-pay | Admitting: Podiatry

## 2021-11-14 DIAGNOSIS — M7661 Achilles tendinitis, right leg: Secondary | ICD-10-CM

## 2021-11-14 DIAGNOSIS — M7662 Achilles tendinitis, left leg: Secondary | ICD-10-CM

## 2021-11-14 DIAGNOSIS — M6788 Other specified disorders of synovium and tendon, other site: Secondary | ICD-10-CM

## 2021-11-14 DIAGNOSIS — Q6689 Other  specified congenital deformities of feet: Secondary | ICD-10-CM

## 2021-11-14 NOTE — Progress Notes (Signed)
  Subjective:  Patient ID: Justin Duran, male    DOB: 02-14-2001,  MRN: [id number]  Chief Complaint  Patient presents with   pes cavus    8  week follow up right foot    21 y.o. male presents with the above complaint. History confirmed with patient.   Interval history: Has not had much improvement, he has been using the night splint and going to physical therapy said it feels about the same and has not improved  Objective:  Physical Exam: warm, good capillary refill, no trophic changes or ulcerative lesions, normal DP and PT pulses, normal sensory exam, and significant pes cavus deformity, he has pain at the Achilles tendon, significant equinus, pain along the peroneal tendons today   Radiographs: Multiple views x-ray of the right foot: no fracture, dislocation, swelling or degenerative changes noted and significant pes cavus deformity Assessment:   1. Achilles tendinitis of both lower extremities   2. Peroneal tendinosis, right      Plan:  Patient was evaluated and treated and all questions answered.  Discussed the etiology and treatment options for Achilles tendinitis including stretching, formal physical therapy with an eccentric exercises therapy plan, supportive shoegears such as a running shoe or sneaker, heel lifts, topical and oral medications.  We also discussed that I do not routinely perform injections in this area because of the risk of an increased damage or rupture of the tendon.  We also discussed the role of surgical treatment of this for patients who do not improve after exhausting non-surgical treatment options.  So far has not had much improvement.  I recommend he continue physical therapy and we increase his mobilization to a cam walker boot and a prescription for this was given to him to take to [facility].  At this point I think an MRI is advisable to evaluate the presence of any possible tearing in the tendon of the Achilles or peroneal tendons.  He may  require surgical intervention including cavus reconstruction.  I will see him back after the MRI for further treatment options.  Return for after MRI to review.

## 2021-11-14 NOTE — Patient Instructions (Addendum)
Call [facility] to schedule your MRI at the below locations.  Please allow at least 1 business day after your visit to process the referral.  It may take longer depending on approval from insurance.  Please let me know if you have issues or problems scheduling the MRI     [facility] [phone] [address] [place], [place] [address]  

## 2021-11-16 ENCOUNTER — Telehealth: Payer: Self-pay

## 2021-11-16 ENCOUNTER — Ambulatory Visit: Payer: Medicaid Other

## 2021-11-16 NOTE — Telephone Encounter (Signed)
TC due to missed visit, line rang busy over several attempts, unable to contact patient, no further visits scheduled.

## 2021-11-20 DIAGNOSIS — Z419 Encounter for procedure for purposes other than remedying health state, unspecified: Secondary | ICD-10-CM | POA: Diagnosis not present

## 2021-11-21 ENCOUNTER — Other Ambulatory Visit: Payer: Medicaid Other

## 2021-12-05 ENCOUNTER — Telehealth: Payer: Self-pay | Admitting: Podiatry

## 2021-12-05 ENCOUNTER — Ambulatory Visit
Admission: RE | Admit: 2021-12-05 | Discharge: 2021-12-05 | Disposition: A | Discharge status: Home / Self Care | Payer: Medicaid Other | Source: Ambulatory Visit | Attending: Podiatry | Admitting: Podiatry

## 2021-12-05 DIAGNOSIS — G8929 Other chronic pain: Secondary | ICD-10-CM | POA: Diagnosis not present

## 2021-12-05 DIAGNOSIS — M19071 Primary osteoarthritis, right ankle and foot: Secondary | ICD-10-CM | POA: Diagnosis not present

## 2021-12-05 DIAGNOSIS — M6788 Other specified disorders of synovium and tendon, other site: Secondary | ICD-10-CM

## 2021-12-05 DIAGNOSIS — M216X1 Other acquired deformities of right foot: Secondary | ICD-10-CM | POA: Diagnosis not present

## 2021-12-05 DIAGNOSIS — M7661 Achilles tendinitis, right leg: Secondary | ICD-10-CM

## 2021-12-05 NOTE — Telephone Encounter (Signed)
Pt states he dropped off his disability documentation to Dr. Sherryle Lis in [place] and he received notification that the medical portion still needs to be completed.  Please advise.

## 2021-12-07 NOTE — Addendum Note (Signed)
Addended bySherryle Lis, Jarom Govan R on: 12/07/2021 08:16 AM   Modules accepted: Orders

## 2021-12-12 ENCOUNTER — Ambulatory Visit
Admission: RE | Admit: 2021-12-12 | Discharge: 2021-12-12 | Disposition: A | Discharge status: Home / Self Care | Payer: Medicaid Other | Source: Ambulatory Visit | Attending: Podiatry | Admitting: Podiatry

## 2021-12-12 DIAGNOSIS — R262 Difficulty in walking, not elsewhere classified: Secondary | ICD-10-CM | POA: Diagnosis not present

## 2021-12-12 DIAGNOSIS — Q6689 Other  specified congenital deformities of feet: Secondary | ICD-10-CM

## 2021-12-21 DIAGNOSIS — Z419 Encounter for procedure for purposes other than remedying health state, unspecified: Secondary | ICD-10-CM | POA: Diagnosis not present

## 2021-12-28 ENCOUNTER — Ambulatory Visit (INDEPENDENT_AMBULATORY_CARE_PROVIDER_SITE_OTHER): Payer: Medicaid Other

## 2021-12-28 ENCOUNTER — Ambulatory Visit (INDEPENDENT_AMBULATORY_CARE_PROVIDER_SITE_OTHER): Payer: Medicaid Other | Admitting: Podiatry

## 2021-12-28 DIAGNOSIS — M7662 Achilles tendinitis, left leg: Secondary | ICD-10-CM

## 2021-12-28 DIAGNOSIS — M6788 Other specified disorders of synovium and tendon, other site: Secondary | ICD-10-CM

## 2021-12-28 DIAGNOSIS — M7661 Achilles tendinitis, right leg: Secondary | ICD-10-CM | POA: Diagnosis not present

## 2021-12-28 DIAGNOSIS — M216X1 Other acquired deformities of right foot: Secondary | ICD-10-CM | POA: Diagnosis not present

## 2021-12-28 DIAGNOSIS — M24571 Contracture, right ankle: Secondary | ICD-10-CM

## 2021-12-28 DIAGNOSIS — Q6671 Congenital pes cavus, right foot: Secondary | ICD-10-CM

## 2021-12-29 ENCOUNTER — Encounter: Payer: Self-pay | Admitting: Podiatry

## 2021-12-30 NOTE — Progress Notes (Signed)
Subjective:  Patient ID: Justin Duran, male    DOB: 22-Oct-2000,  MRN: [id number]  Chief Complaint  Patient presents with   Tendonitis    Follow up after MRI right - new xrays today    21 y.o. male presents with the above complaint. History confirmed with patient.   Interval history: He completed his CT and MRI scan.  He still has quite a bit of pain.  Physical therapy has not really helped much.  Objective:  Physical Exam: warm, good capillary refill, no trophic changes or ulcerative lesions, normal DP and PT pulses, normal sensory exam, and significant pes cavus deformity, he has pain at the Achilles tendon and along the lateral ankle and peroneus longus with resisted eversion, significant equinus.  On examination his deformity is nearly fully reducible, his forefoot valgus is reducible as well with dorsiflexion of the first ray.   Radiographs: Multiple views x-ray of the right foot: no fracture, dislocation, swelling or degenerative changes noted and significant pes cavus deformity , new calcaneal axial views today taken show a relatively straight calcaneus with varus positioning  MRI 12/05/2021: IMPRESSION: 1. No acute osseous injury of the right foot. 2. Relative pes cavus. 3. Mild tendinosis of the peroneus longus.     Electronically Signed   By: Kathreen Devoid M.D.   On: 12/14/2021 09:34  CT scan 12/12/2021 IMPRESSION: 1. No acute osseous injury of the right foot. 2. Relative pes cavus. 3. Mild tendinosis of the peroneus longus.     Electronically Signed   By: Kathreen Devoid M.D.   On: 12/14/2021 09:34 Assessment:   1. Congenital pes cavus, right foot   2. Peroneal tendinosis, right   3. Acquired cavovarus deformity of foot, right   4. Equinus contracture of right ankle   5. 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 Achilles tendinitis of both lower extremities      Plan:  Patient was evaluated and treated and all questions answered.  We  reviewed his clinical progress and so far he has made all reasonable attempts for nonsurgical treatment at this point.  We discussed further treatment of this including surgical correction which he feels like he is ready to schedule at this point.  Surgical we discussed the following procedures: Calcaneal osteotomy with a lateral and dorsiflexory slide with screw fixation, peroneal switch with transfer of the longus to the brevis, intramuscular posterior tibial lengthening with possible talonavicular capsulotomy, dorsiflexor wedge osteotomy of the first metatarsal, a Steindler stripping and possible tendo Achilles lengthening.  We discussed the rationale for these procedures as we reviewed his x-rays and clinical findings.  I also discussed this case with my partner Dr. Posey Pronto who was able to examine the patient with me and he concurs with the opinion.  We discussed the risk benefits and potential complications of this extensive procedure including but not limited to  pain, swelling, infection, scar, numbness which may be temporary or permanent, chronic pain, stiffness, nerve pain or damage, wound healing problems, bone healing problems including delayed or non-union.  We also discussed the postoperative course and recovery process including the period of nonweightbearing which will be approximately 6 to 8 weeks.  All questions were addressed.  Informed consent was signed and reviewed.  Surgery will be scheduled at the hospital.    Surgical plan:  Procedure: -Right foot and ankle, lateral MIS calc slide (& DF), peroneal transfer, PT lengthening IM +/- TN capsulotomy, DFWO 1st met, Steindler Stripping, TAL (  possible)  Location: -[facility]  Anesthesia plan: -General anesthesia with a regional block  Postoperative pain plan: - Tylenol 1000 mg every 6 hours,gabapentin 300 mg every 8 hours x5 days, oxycodone 5 mg 1-2 tabs every 6 hours only as needed  DVT prophylaxis: -Xarelto 10 mg nightly  WB  Restrictions / DME needs: -NWB in splint postop    No follow-ups on file.

## 2022-01-20 DIAGNOSIS — Z419 Encounter for procedure for purposes other than remedying health state, unspecified: Secondary | ICD-10-CM | POA: Diagnosis not present

## 2022-01-23 DIAGNOSIS — H5213 Myopia, bilateral: Secondary | ICD-10-CM | POA: Diagnosis not present

## 2022-02-03 ENCOUNTER — Ambulatory Visit (INDEPENDENT_AMBULATORY_CARE_PROVIDER_SITE_OTHER): Payer: Medicaid Other | Admitting: Nurse Practitioner

## 2022-02-03 ENCOUNTER — Telehealth: Payer: Self-pay | Admitting: *Deleted

## 2022-02-03 ENCOUNTER — Encounter: Payer: Self-pay | Admitting: Nurse Practitioner

## 2022-02-03 ENCOUNTER — Ambulatory Visit (INDEPENDENT_AMBULATORY_CARE_PROVIDER_SITE_OTHER)
Admission: RE | Admit: 2022-02-03 | Discharge: 2022-02-03 | Disposition: A | Discharge status: Home / Self Care | Payer: Medicaid Other | Source: Ambulatory Visit | Attending: Nurse Practitioner | Admitting: Nurse Practitioner

## 2022-02-03 VITALS — BP 140/82 | HR 88 | Temp 97.3°F | Resp 16 | Ht 71.0 in | Wt 235.0 lb

## 2022-02-03 DIAGNOSIS — Z6832 Body mass index (BMI) 32.0-32.9, adult: Secondary | ICD-10-CM | POA: Diagnosis not present

## 2022-02-03 DIAGNOSIS — Z23 Encounter for immunization: Secondary | ICD-10-CM | POA: Diagnosis not present

## 2022-02-03 DIAGNOSIS — F1290 Cannabis use, unspecified, uncomplicated: Secondary | ICD-10-CM

## 2022-02-03 DIAGNOSIS — E669 Obesity, unspecified: Secondary | ICD-10-CM

## 2022-02-03 DIAGNOSIS — Z9889 Other specified postprocedural states: Secondary | ICD-10-CM

## 2022-02-03 DIAGNOSIS — Z789 Other specified health status: Secondary | ICD-10-CM | POA: Diagnosis not present

## 2022-02-03 DIAGNOSIS — Z0000 Encounter for general adult medical examination without abnormal findings: Secondary | ICD-10-CM | POA: Diagnosis not present

## 2022-02-03 DIAGNOSIS — Z01818 Encounter for other preprocedural examination: Secondary | ICD-10-CM

## 2022-02-03 DIAGNOSIS — Q6651 Congenital pes planus, right foot: Secondary | ICD-10-CM

## 2022-02-03 LAB — CBC
HCT: 48.6 % (ref 39.0–52.0)
Hemoglobin: 16.5 g/dL (ref 13.0–17.0)
MCHC: 33.9 g/dL (ref 30.0–36.0)
MCV: 80.8 fl (ref 78.0–100.0)
Platelets: 240 10*3/uL (ref 150.0–400.0)
RBC: 6.01 Mil/uL — ABNORMAL HIGH (ref 4.22–5.81)
RDW: 13.5 % (ref 11.5–15.5)
WBC: 7.2 10*3/uL (ref 4.0–10.5)

## 2022-02-03 LAB — TSH: TSH: 2.43 u[IU]/mL (ref 0.35–5.50)

## 2022-02-03 LAB — HEMOGLOBIN A1C: Hgb A1c MFr Bld: 5.5 % (ref 4.6–6.5)

## 2022-02-03 LAB — COMPREHENSIVE METABOLIC PANEL
ALT: 24 U/L (ref 0–53)
AST: 19 U/L (ref 0–37)
Albumin: 4.3 g/dL (ref 3.5–5.2)
Alkaline Phosphatase: 102 U/L (ref 39–117)
BUN: 16 mg/dL (ref 6–23)
CO2: 28 mEq/L (ref 19–32)
Calcium: 9.6 mg/dL (ref 8.4–10.5)
Chloride: 105 mEq/L (ref 96–112)
Creatinine, Ser: 0.86 mg/dL (ref 0.40–1.50)
GFR: 122.03 mL/min (ref >60.00)
Glucose, Bld: 99 mg/dL (ref 70–99)
Potassium: 3.9 mEq/L (ref 3.5–5.1)
Sodium: 141 mEq/L (ref 135–145)
Total Bilirubin: 0.6 mg/dL (ref 0.2–1.2)
Total Protein: 7.1 g/dL (ref 6.0–8.3)

## 2022-02-03 LAB — LIPID PANEL
Cholesterol: 140 mg/dL (ref 0–200)
HDL: 28.7 mg/dL — ABNORMAL LOW (ref >39.00)
LDL Cholesterol: 91 mg/dL (ref 0–99)
NonHDL: 111.01
Total CHOL/HDL Ratio: 5
Triglycerides: 100 mg/dL (ref 0.0–149.0)
VLDL: 20 mg/dL (ref 0.0–40.0)

## 2022-02-03 NOTE — Telephone Encounter (Signed)
Submitted online via [email or web address] for [facility] PENDING  This request requires additional review. Please upload clinical information to support this request via [email or web address] or fax it to [phone]. You will receive notification when the case has been approved or if additional information is necessary to complete the review. To check the status of this request go to [email or web address]. Clinical review criteria are also available online or upon request. Your tracking number is [id number].  *Clinicals uploaded - 02/03/2022 11:13AM This request is being reviewed by our clinicians  **Required Documents - Last EKG performed (tracing) - Results and/or reports of all prior cardiac imaging performed (i.e. stress testing, cardiac catheterizations, CCTA, TTE, etc.) - Recent office visit (clinical) notes  Patient did not have an EKG done at today's visit and we do not have a recent one on file. We can see what happens with the Auth and IF they need it he may have to come back in for an EKG.   PCP is aware

## 2022-02-03 NOTE — Telephone Encounter (Signed)
-----   Message from Minette Brine sent at 02/03/2022  8:23 AM EST ----- We received an order for an Echocardiogram from your Provider.  Will you please check to see if a Prior Authorization is needed for the following CPT codes: 81448, [id number], [phone].  Once PA# is completed  please document on  the order in the comment line and we will be glad to call and schedule.  The appointment will not be able to be made until the PA# is obtained. Thank you so much for your help with this.

## 2022-02-03 NOTE — Assessment & Plan Note (Signed)
Encouraged healthy lifestyle modifications.  Inclusive of 30 minutes of exercise 5 times a week.

## 2022-02-03 NOTE — Telephone Encounter (Signed)
Noted if it gets denied then we can get patient back in for a EKG. 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 I was getting an Echo to make sure heart is normal structure and function prior to doing general anesthesia

## 2022-02-03 NOTE — Assessment & Plan Note (Signed)
Patient is to have Achilles tendon surgery on March 17, 2021 with podiatrist.  Patient is here for preop clearance.  Ambiguous heart surgery in the past patient states he does not think he was followed by cardiologist given unsure nature will elect to get an echocardiogram to see the structure of the heart patient thinks something with the valve.  No history of surgeries as an adult so unsure if he handles anesthesia well or not no family history of problems with anesthesia

## 2022-02-03 NOTE — Assessment & Plan Note (Signed)
Patient has electronic cigarettes.  Encourage patient to stop use.  Since patient is smoking marijuana and electronic cigarettes obtain chest x-ray prior to clearing for surgery.

## 2022-02-03 NOTE — Progress Notes (Signed)
New Patient Office Visit  Subjective    Patient ID: Justin Duran, male    DOB: [birth year]  Age: 21 y.o. MRN: [id number]  CC:  Chief Complaint  Patient presents with   Establish Care    Requesting surgical clearance form to be filled out.     HPI Justin Duran presents to establish care  Surgical clearance: States that he has not had any adult surgeries. Hx hernia heart surgery as a kid. States that he did not see a cardiologist. No fh of anesthiesa problem   for complete physical and follow up of chronic conditions.  Immunizations: -Tetanus:update -Influenza: refused -Shingles: Too young -Pneumonia: Too young - covid refused -HPV:  Diet: Fair diet. States that he does 3 meals a day sometimes just 1-2. Does snack. Water mtn dew. Sweet tea. Majority mtn dew  Exercise: Tries to. 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  States he will "piddle around"  Eye exam: Completes annually. Last week and wears glasses.  Dental exam: Needs updating   Colonoscopy: Too young, currently average risk Lung Cancer Screening: Does not qualify but does use electronic cigarettes. Dexa: N/A  PSA: Too young, currently average risk  Sleep: States that he goes to bed around 10-12 and will get up aroun 7-10. Feels rested. Does snore  Not currently sexality active and no concerns for STIs/STDs.   Has thought about stopping THC and vaping, encouraged the same     Outpatient Encounter Medications as of 02/03/2022  Medication Sig   meloxicam (MOBIC) 15 MG tablet Take 1 tablet (15 mg total) by mouth daily. (Patient not taking: Reported on 02/03/2022)   oxyCODONE-acetaminophen (PERCOCET) 5-325 MG tablet Take 1 tablet by mouth every 4 (four) hours as needed for severe pain. (Patient not taking: Reported on 02/03/2022)   No facility-administered encounter medications on file as of 02/03/2022.    History reviewed. No pertinent past medical history.  Past Surgical History:   Procedure Laterality Date   CARDIAC SURGERY     CIRCUMCISION     HERNIA REPAIR  12/22/1998   Performed at [facility]    Family History  Problem Relation Age of Onset   Depression Mother    Migraines Mother    Alzheimer's disease Maternal Grandmother    Heart Problems Paternal Grandfather     Social History   Socioeconomic History   Marital status: Single    Spouse name: Not on file   Number of children: 0   Years of education: Not on file   Highest education level: Not on file  Occupational History   Not on file  Tobacco Use   Smoking status: Every Day    Types: E-cigarettes   Smokeless tobacco: Never  Vaping Use   Vaping Use: Every day   Substances: Nicotine, Flavoring  Substance and Sexual Activity   Alcohol use: No    Alcohol/week: 0.0 standard drinks of alcohol   Drug use: Yes    Types: Marijuana    Comment: 3-4 times per week. for sleep   Sexual activity: Not on file  Other Topics Concern   Not on file  Social History Narrative   States that he was working at [employer] lumbar but been out for surgery      Hobbies: hunting, fishing, and basketball, video games   Social Determinants of Health   Financial Resource Strain: Not on file  Food Insecurity: Not on file  Transportation Needs: Not on file  Physical Activity: Not on file  Stress: Not on file  Social Connections: Not on file  Intimate Partner Violence: Not on file    Review of Systems  Constitutional:  Negative for chills and fever.  Respiratory:  Negative for shortness of breath.   Cardiovascular:  Negative for chest pain.  Gastrointestinal:  Negative for abdominal pain, constipation, diarrhea, nausea and vomiting.       BM daily   Genitourinary:  Negative for dysuria and hematuria.  Neurological:  Negative for tingling and headaches.  Psychiatric/Behavioral:  Negative for hallucinations and suicidal ideas.         Objective    BP (!) 140/82   Pulse 88   Temp (!) 97.3 F (36.3  C) (Temporal)   Resp 16   Ht 5\' 11"  (1.803 m)   Wt 235 lb (106.6 kg)   SpO2 97%   BMI 32.78 kg/m   Physical Exam Nursing note reviewed. Exam conducted with a chaperone present , CMA).  Constitutional:      Appearance: Normal appearance. He is obese.  HENT:     Right Ear: Tympanic membrane, ear canal and external ear normal.     Left Ear: Tympanic membrane, ear canal and external ear normal.     Mouth/Throat:     Mouth: Mucous membranes are moist.     Pharynx: Oropharynx is clear.  Eyes:     Extraocular Movements: Extraocular movements intact.     Pupils: Pupils are equal, round, and reactive to light.     Comments: Wears glasses  Cardiovascular:     Rate and Rhythm: Normal rate and regular rhythm.     Pulses: Normal pulses.     Heart sounds: Normal heart sounds.  Pulmonary:     Effort: Pulmonary effort is normal.     Breath sounds: Normal breath sounds.  Abdominal:     General: Bowel sounds are normal. There is no distension.     Palpations: There is no mass.     Tenderness: There is no abdominal tenderness.     Hernia: No hernia is present. There is no hernia in the left inguinal area or right inguinal area.  Genitourinary:    Penis: Normal.      Testes: Normal.     Epididymis:     Right: Normal.     Left: Normal.  Musculoskeletal:     Right lower leg: No edema.     Left lower leg: No edema.  Lymphadenopathy:     Cervical: No cervical adenopathy.     Lower Body: No right inguinal adenopathy. No left inguinal adenopathy.  Skin:    General: Skin is warm.  Neurological:     General: No focal deficit present.     Mental Status: He is alert.     Deep Tendon Reflexes:     Reflex Scores:      Bicep reflexes are 2+ on the right side and 2+ on the left side.      Patellar reflexes are 2+ on the right side and 2+ on the left side.    Comments: Bilateral upper and lower extremity strength 5/5  Psychiatric:        Mood and Affect: Mood normal.         Behavior: Behavior normal.        Thought Content: Thought content normal.        Judgment: Judgment normal.         Assessment & Plan:   Problem  List Items Addressed This Visit       Other   Preventative health care - Primary    Discussed age-appropriate immunizations and screening exams.  Patient is too young for colonoscopy and prostate screening.  Updated tetanus vaccine today.  Patient was given information at discharge in regards to preventative healthcare maintenance and anticipatory guidance for his age range.  Patient states he is not currently sexually active and no concern for STIs      Relevant Orders   CBC   Comprehensive metabolic panel   Lipid panel   TSH   Hemoglobin A1c   History of heart surgery    Patient states history of heart surgery as a child.  Something with a valve was not opening per his report.  Obtain echocardiogram prior to clearing him surgically      Relevant Orders   ECHOCARDIOGRAM COMPLETE   Pre-op exam    Patient is to have Achilles tendon surgery on March 17, 2021 with podiatrist.  Patient is here for preop clearance.  Ambiguous heart surgery in the past patient states he does not think he was followed by cardiologist given unsure nature will elect to get an echocardiogram to see the structure of the heart patient thinks something with the valve.  No history of surgeries as an adult so unsure if he handles anesthesia well or not no family history of problems with anesthesia      Relevant Orders   DG Chest 2 View   ECHOCARDIOGRAM COMPLETE   Obesity (BMI 30-39.9)    Encouraged healthy lifestyle modifications.  Inclusive of 30 minutes of exercise 5 times a week.      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  Therefore patient sepsis does not we will [place] and the risk of having inferior substance would not regulated.  Encourage patient to stop patient knowledged       Relevant Orders   DG Chest 2 View   Electronic cigarette use    Patient has electronic cigarettes.  Encourage patient to stop use.  Since patient is smoking marijuana and electronic cigarettes obtain chest x-ray prior to clearing for surgery.      Relevant Orders   DG Chest 2 View   Other Visit Diagnoses     Need for Tdap vaccination       Relevant Orders   Tdap vaccine greater than or equal to 7yo IM (Completed)       Return in about 1 year (around 02/04/2023) for CPe and labs.   Audria Nine, NP

## 2022-02-03 NOTE — Assessment & Plan Note (Signed)
Discussed age-appropriate immunizations and screening exams.  Patient is too young for colonoscopy and prostate screening.  Updated tetanus vaccine today.  Patient was given information at discharge in regards to preventative healthcare maintenance and anticipatory guidance for his age range.  Patient states he is not currently sexually active and no concern for STIs

## 2022-02-03 NOTE — Assessment & Plan Note (Signed)
Uses this 2-3 times a week.  Therefore patient sepsis does not we will [place] and the risk of having inferior substance would not regulated.  Encourage patient to stop patient knowledged

## 2022-02-03 NOTE — Patient Instructions (Signed)
Nice to see you today I will be in touch with the labs, xray and echo once I have the results Follow up with me in 1 year for your next physical, sooner if you need me Once I have all the test results I will fill out the clearance form for your surgery  Work on trying to exercise 30 mins a day 5 days a week, once we get recovered from surgery

## 2022-02-03 NOTE — Assessment & Plan Note (Signed)
Patient states history of heart surgery as a child.  Something with a valve was not opening per his report.  Obtain echocardiogram prior to clearing him surgically

## 2022-02-07 NOTE — Telephone Encounter (Signed)
This is still under review with [facility] Medicaid Clinicals were uploaded 02/03/2022, these requests can take up to 7-10 business days to review and approve

## 2022-02-07 NOTE — Telephone Encounter (Signed)
He does not need a Transthoracic Echocardiogram (TEE). He just needs A 2D echo where they do the [place] on his chest wall

## 2022-02-07 NOTE — Telephone Encounter (Signed)
Received a letter this morning 02/17/22   We have received your request for Transthoracic Echocardiogram. We are unable to approve based on the information provided  to date, please respond to this fax as soon as possible.  Additional information is still needed We have received your request for Transthoracic Echocardiogram along with additional records. However, the  information provided still does not support the medical necessity of these services to make a determination on this case.  Please see the documentation needed below which may allow [place] to make a positive determination. Only sending daily  notes may delay authorization.  -Details of patients cardiovascular symptoms. -TTE attached had no report, please send report   You may contact [employer]IA (insurance) directly to discuss the need for the Echo  [employer] [phone] TRACKING NUMBER: [phone]  If we have the patient come in for an EKG it could still deny with insurance if they feel they do not have enough clinical information to approve medically necessary.

## 2022-02-09 NOTE — Telephone Encounter (Signed)
When we put in the dx code for Echo 93306 - that's how it comes up on their end. Could be an error on their side. But either way we do not have all their required conservative treatment completed prior to requesting this Echo (xray)  You will have to contact [employer]IA to discuss this approval as I have exhausted all my options from my end on attempting to get his approved.

## 2022-02-14 NOTE — Telephone Encounter (Signed)
Is there a number that needs to be called and speak with someone about approval?

## 2022-02-15 NOTE — Telephone Encounter (Signed)
Noted. Will wait for determination.

## 2022-02-15 NOTE — Telephone Encounter (Signed)
Called and spoke with [facility] who transferred me to Dr. Cherlyn Roberts (cardiologist). She took notes and will send it forward to be reviewed and we should hear back per her report  Call reference number [id number]

## 2022-02-15 NOTE — Telephone Encounter (Signed)
Yes, below in message  You may contact NIA (insurance) directly to discuss the need for the Echo  [employer]IA [phone] TRACKING NUMBER: [phone]

## 2022-02-17 ENCOUNTER — Telehealth: Payer: Self-pay | Admitting: Podiatry

## 2022-02-17 NOTE — Telephone Encounter (Signed)
Called pt and asked them to call back to confirm/update mailing address. What we have on file is not what is coming back and I want to make sure we have the correct address as I need to put it on the prior authorization form I have to fax out for his upcoming surgery on 03/17/2022.

## 2022-02-20 DIAGNOSIS — Z419 Encounter for procedure for purposes other than remedying health state, unspecified: Secondary | ICD-10-CM | POA: Diagnosis not present

## 2022-02-22 ENCOUNTER — Telehealth: Payer: Self-pay | Admitting: Podiatry

## 2022-02-22 NOTE — Telephone Encounter (Signed)
DOS: 03/17/2022  Dr. Posey Pronto Assist  [place] Medicaid [employer]  Tendo-Achilles Length Rt [phone]) Tenodesis Rt [phone]) Ellard Artis Calcaneal Osteotomy Rt [phone]) Base Wedge Osteotomy Rt [phone]) Division of Plantar Fascia Muscle Rt (83151) Capsulotomy Mid Foot with Tendon Lengthening Rt (76160)  Authorization #: [id number] Authorization Valid: 03/17/2022 - 06/07/2022 Authorization for CPT Codes 48546 & 27035  CPT Codes 00938, 18299, 37169, & 67893 do not require prior authorization.

## 2022-02-27 ENCOUNTER — Telehealth: Payer: Self-pay | Admitting: Nurse Practitioner

## 2022-02-27 NOTE — Telephone Encounter (Signed)
I had called and spoke with a cardiologist through his insurance. Have we heard anything about him getting a 2 D echo done for pre op clearance

## 2022-03-01 NOTE — Telephone Encounter (Signed)
This was finally approved by his insurance 02/28/2022 I let Edmon Crape with Cardiology know this was approved and she has been trying to reach him to schedule                02/28/22 [facility] to schedule @ 11:17/LBW

## 2022-03-01 NOTE — Telephone Encounter (Signed)
This was finally approved by his insurance 02/28/2022 I let Lisa Welch with Cardiology know this was approved and she has been trying to reach him to schedule                02/28/22 [facility] to schedule @ 11:17/LBW  

## 2022-03-02 ENCOUNTER — Encounter (HOSPITAL_COMMUNITY): Payer: Self-pay

## 2022-03-07 ENCOUNTER — Encounter (HOSPITAL_COMMUNITY): Payer: Self-pay | Admitting: Nurse Practitioner

## 2022-03-08 ENCOUNTER — Ambulatory Visit: Payer: Medicaid Other | Admitting: Nurse Practitioner

## 2022-03-08 NOTE — Telephone Encounter (Signed)
Patient scheduled OV with [facility] for today regarding his surgical clearance. Per [place] he cannot complete the forms until patient has completed his Echo and we have the report.   Spoke with patient and advised. He has not scheduled his Echo yet. Phone number to Cardiology provided to patient and advised him to try and schedule this as soon as possible. Patient states he will call today. Also advised patient of Matt's comments and that we may not need him to come to office for an additional visit, pending Echo results. Patient voiced understanding. Nothing further needed at this time.

## 2022-03-09 ENCOUNTER — Ambulatory Visit (HOSPITAL_COMMUNITY): Payer: Medicaid Other | Attending: Internal Medicine

## 2022-03-09 DIAGNOSIS — Z9889 Other specified postprocedural states: Secondary | ICD-10-CM | POA: Diagnosis not present

## 2022-03-09 DIAGNOSIS — Z01818 Encounter for other preprocedural examination: Secondary | ICD-10-CM

## 2022-03-09 DIAGNOSIS — Z01810 Encounter for preprocedural cardiovascular examination: Secondary | ICD-10-CM

## 2022-03-09 LAB — ECHOCARDIOGRAM COMPLETE
Area-P 1/2: 3.74 cm2
S' Lateral: 3.35 cm

## 2022-03-09 NOTE — Telephone Encounter (Signed)
error 

## 2022-03-13 ENCOUNTER — Encounter
Admission: RE | Admit: 2022-03-13 | Discharge: 2022-03-13 | Disposition: A | Discharge status: Home / Self Care | Payer: Medicaid Other | Source: Ambulatory Visit | Attending: Podiatry | Admitting: Podiatry

## 2022-03-13 NOTE — Patient Instructions (Signed)
Your procedure is scheduled on: Friday March 17, 2022. Report to Day Surgery inside [facility] 2nd floor, stop by registration desk before getting on elevator. To find out your arrival time please call [phone] between 1PM - 3PM on Thursday March 16, 2022.  Remember: Instructions that are not followed completely may result in serious medical risk,  up to and including death, or upon the discretion of your surgeon and anesthesiologist your  surgery may need to be rescheduled.     _X__ 1. Do not eat food or drink fluids after midnight the night before your procedure.                 No chewing gum or hard candies.  __X__2.  On the morning of surgery brush your teeth with toothpaste and water, you                may rinse your mouth with mouthwash if you wish.  Do not swallow any toothpaste or mouthwash.     _X__ 3.  No Alcohol for 24 hours before or after surgery.   _X__ 4.  Do Not Smoke or use e-cigarettes For 24 Hours Prior to Your Surgery.                 Do not use any chewable tobacco products for at least 6 hours prior to                 Surgery.  _X__  5.  Do not use any recreational drugs (marijuana, cocaine, heroin, ecstasy, MDMA or other)                For at least one week prior to your surgery.  Combination of these drugs with anesthesia                May have life threatening results.  ____  6.  Bring all medications with you on the day of surgery if instructed.   __X__  7.  Notify your doctor if there is any change in your medical condition      (cold, fever, infections).     Do not wear jewelry, make-up, hairpins, clips or nail polish. Do not wear lotions, powders, or perfumes. You may wear deodorant. Do not shave 48 hours prior to surgery. Men may shave face and neck. Do not bring valuables to the hospital.    [facility] is not responsible for any belongings or valuables.  Contacts, dentures or bridgework may not be worn into  surgery. Leave your suitcase in the car. After surgery it may be brought to your room. For patients admitted to the hospital, discharge time is determined by your treatment team.   Patients discharged the day of surgery will not be allowed to drive home.   Make arrangements for someone to be with you for the first 24 hours of your Same Day Discharge.   __X__ Take these medicines the morning of surgery with A SIP OF WATER:    1. None   2.   3.   4.  5.  6.  ____ Fleet Enema (as directed)   __X__ Use CHG Soap (or wipes) as directed  ____ Use Benzoyl Peroxide Gel as instructed  ____ Use inhalers on the day of surgery  ____ Stop metformin 2 days prior to surgery    ____ Take 1/2 of usual insulin dose the night before surgery. No insulin the morning  of surgery.   ____ Call your PCP, cardiologist, or Pulmonologist if taking Coumadin/Plavix/aspirin and ask when to stop before your surgery.   __X__ One Week prior to surgery- Stop Anti-inflammatories such as Ibuprofen, Aleve, Advil, Motrin, meloxicam (MOBIC), diclofenac, etodolac, ketorolac, Toradol, Daypro, piroxicam, Goody's or BC powders. OK TO USE TYLENOL IF NEEDED   __X__ Do not start any new vitamins and or supplements until after surgery.    ____ Bring C-Pap to the hospital.    If you have any questions regarding your pre-procedure instructions,  Please call Pre-admit Testing at [phone]    Preparing for Surgery with CHLORHEXIDINE GLUCONATE (CHG) Soap  Chlorhexidine Gluconate (CHG) Soap  o An antiseptic cleaner that kills germs and bonds with the skin to continue killing germs even after washing  o Used for showering the night before surgery and morning of surgery  Before surgery, you can play an important role by reducing the number of germs on your skin.  CHG (Chlorhexidine gluconate) soap is an antiseptic cleanser which kills germs and bonds with the skin to continue killing germs even after  washing.  Please do not use if you have an allergy to CHG or antibacterial soaps. If your skin becomes reddened/irritated stop using the CHG.  1. Shower the NIGHT BEFORE SURGERY and the MORNING OF SURGERY with CHG soap.  2. If you choose to wash your hair, wash your hair first as usual with your normal shampoo.  3. After shampooing, rinse your hair and body thoroughly to remove the shampoo.  4. Use CHG as you would any other liquid soap. You can apply CHG directly to the skin and wash gently with a scrungie or a clean washcloth.  5. Apply the CHG soap to your body only from the neck down. Do not use on open wounds or open sores. Avoid contact with your eyes, ears, mouth, and genitals (private parts). Wash face and genitals (private parts) with your normal soap.  6. Wash thoroughly, paying special attention to the area where your surgery will be performed.  7. Thoroughly rinse your body with warm water.  8. Do not shower/wash with your normal soap after using and rinsing off the CHG soap.  9. Pat yourself dry with a clean towel.  10. Wear clean pajamas to bed the night before surgery.  12. Place clean sheets on your bed the night of your first shower and do not sleep with pets.  13. Shower again with the CHG soap on the day of surgery prior to arriving at the hospital.  14. Do not apply any deodorants/lotions/powders.  15. Please wear clean clothes to the hospital.

## 2022-03-16 MED ORDER — CEFAZOLIN SODIUM-DEXTROSE 2-4 GM/100ML-% IV SOLN
2.0000 g | INTRAVENOUS | Status: AC
  Administered 2022-03-17: 2 g via INTRAVENOUS

## 2022-03-16 MED ORDER — ORAL CARE MOUTH RINSE: 15.0000 mL | Freq: Once | OROMUCOSAL | Status: AC

## 2022-03-16 MED ORDER — CHLORHEXIDINE GLUCONATE 0.12 % MT SOLN: 15.0000 mL | Freq: Once | OROMUCOSAL | Status: AC

## 2022-03-16 MED ORDER — FAMOTIDINE 20 MG PO TABS: 20.0000 mg | ORAL_TABLET | Freq: Once | ORAL | Status: AC

## 2022-03-16 MED ORDER — LACTATED RINGERS IV SOLN: INTRAVENOUS | Status: DC

## 2022-03-17 ENCOUNTER — Other Ambulatory Visit: Payer: Self-pay

## 2022-03-17 ENCOUNTER — Telehealth: Payer: Self-pay | Admitting: *Deleted

## 2022-03-17 ENCOUNTER — Encounter
Admission: RE | Disposition: A | Discharge status: Home / Self Care | Payer: Self-pay | Source: Ambulatory Visit | Attending: Podiatry

## 2022-03-17 ENCOUNTER — Ambulatory Visit: Payer: 59

## 2022-03-17 ENCOUNTER — Ambulatory Visit: Payer: 59 | Admitting: Certified Registered"

## 2022-03-17 ENCOUNTER — Encounter: Payer: Self-pay | Admitting: Podiatry

## 2022-03-17 ENCOUNTER — Ambulatory Visit
Admission: RE | Admit: 2022-03-17 | Discharge: 2022-03-17 | Disposition: A | Discharge status: Home / Self Care | Payer: 59 | Source: Ambulatory Visit | Attending: Podiatry | Admitting: Podiatry

## 2022-03-17 DIAGNOSIS — Q6681 Congenital vertical talus deformity, right foot: Secondary | ICD-10-CM | POA: Diagnosis not present

## 2022-03-17 DIAGNOSIS — M21071 Valgus deformity, not elsewhere classified, right ankle: Secondary | ICD-10-CM | POA: Diagnosis not present

## 2022-03-17 DIAGNOSIS — Q6671 Congenital pes cavus, right foot: Secondary | ICD-10-CM | POA: Diagnosis not present

## 2022-03-17 DIAGNOSIS — M6240 Contracture of muscle, unspecified site: Secondary | ICD-10-CM

## 2022-03-17 DIAGNOSIS — F1290 Cannabis use, unspecified, uncomplicated: Secondary | ICD-10-CM | POA: Diagnosis not present

## 2022-03-17 DIAGNOSIS — M21171 Varus deformity, not elsewhere classified, right ankle: Secondary | ICD-10-CM | POA: Diagnosis not present

## 2022-03-17 DIAGNOSIS — E669 Obesity, unspecified: Secondary | ICD-10-CM | POA: Insufficient documentation

## 2022-03-17 DIAGNOSIS — F17200 Nicotine dependence, unspecified, uncomplicated: Secondary | ICD-10-CM | POA: Insufficient documentation

## 2022-03-17 DIAGNOSIS — M62471 Contracture of muscle, right ankle and foot: Secondary | ICD-10-CM | POA: Diagnosis not present

## 2022-03-17 DIAGNOSIS — Z6833 Body mass index (BMI) 33.0-33.9, adult: Secondary | ICD-10-CM | POA: Insufficient documentation

## 2022-03-17 DIAGNOSIS — M24571 Contracture, right ankle: Secondary | ICD-10-CM | POA: Insufficient documentation

## 2022-03-17 DIAGNOSIS — G8918 Other acute postprocedural pain: Secondary | ICD-10-CM | POA: Diagnosis not present

## 2022-03-17 DIAGNOSIS — M216X1 Other acquired deformities of right foot: Secondary | ICD-10-CM | POA: Diagnosis not present

## 2022-03-17 HISTORY — PX: CALCANEAL OSTEOTOMY: SHX1281

## 2022-03-17 HISTORY — PX: PLANTAR FASCIA RELEASE: SHX2239

## 2022-03-17 HISTORY — PX: METATARSAL OSTEOTOMY: SHX1641

## 2022-03-17 HISTORY — PX: CAPSULOTOMY: SHX379

## 2022-03-17 SURGERY — OSTEOTOMY, METATARSAL BONE
Anesthesia: General | Site: Toe | Laterality: Right

## 2022-03-17 MED ORDER — ACETAMINOPHEN 10 MG/ML IV SOLN: 1000.0000 mg | Freq: Once | INTRAVENOUS | Status: DC | PRN

## 2022-03-17 MED ORDER — SODIUM CHLORIDE 0.9 % IR SOLN
Status: DC | PRN
  Administered 2022-03-17: 500 mL

## 2022-03-17 MED ORDER — DEXMEDETOMIDINE HCL IN NACL 200 MCG/50ML IV SOLN
INTRAVENOUS | Status: DC | PRN
  Administered 2022-03-17: 3 ug via INTRAVENOUS
  Administered 2022-03-17: 8 ug via INTRAVENOUS
  Administered 2022-03-17: 12 ug via INTRAVENOUS

## 2022-03-17 MED ORDER — OXYCODONE HCL 5 MG PO TABS: 5.0000 mg | ORAL_TABLET | Freq: Once | ORAL | Status: DC | PRN

## 2022-03-17 MED ORDER — MIDAZOLAM HCL 2 MG/2ML IJ SOLN
INTRAMUSCULAR | Status: AC
  Filled 2022-03-17: qty 2

## 2022-03-17 MED ORDER — BUPIVACAINE HCL (PF) 0.5 % IJ SOLN
INTRAMUSCULAR | Status: AC
  Filled 2022-03-17: qty 30

## 2022-03-17 MED ORDER — OXYCODONE HCL 5 MG PO TABS: 5.0000 mg | ORAL_TABLET | ORAL | 0 refills | Status: AC | PRN

## 2022-03-17 MED ORDER — FENTANYL CITRATE PF 50 MCG/ML IJ SOSY: 50.0000 ug | PREFILLED_SYRINGE | Freq: Once | INTRAMUSCULAR | Status: AC

## 2022-03-17 MED ORDER — ROCURONIUM BROMIDE 100 MG/10ML IV SOLN
INTRAVENOUS | Status: DC | PRN
  Administered 2022-03-17: 50 mg via INTRAVENOUS

## 2022-03-17 MED ORDER — CEFAZOLIN SODIUM-DEXTROSE 2-4 GM/100ML-% IV SOLN
INTRAVENOUS | Status: AC
  Filled 2022-03-17: qty 100

## 2022-03-17 MED ORDER — CHLORHEXIDINE GLUCONATE 0.12 % MT SOLN
OROMUCOSAL | Status: AC
  Administered 2022-03-17: 15 mL via OROMUCOSAL
  Filled 2022-03-17: qty 15

## 2022-03-17 MED ORDER — DEXAMETHASONE SODIUM PHOSPHATE 10 MG/ML IJ SOLN
INTRAMUSCULAR | Status: DC | PRN
  Administered 2022-03-17: 10 mg via INTRAVENOUS

## 2022-03-17 MED ORDER — FENTANYL CITRATE (PF) 100 MCG/2ML IJ SOLN
INTRAMUSCULAR | Status: AC
  Filled 2022-03-17: qty 2

## 2022-03-17 MED ORDER — PROPOFOL 10 MG/ML IV BOLUS
INTRAVENOUS | Status: AC
  Filled 2022-03-17: qty 120

## 2022-03-17 MED ORDER — LIDOCAINE HCL (CARDIAC) PF 100 MG/5ML IV SOSY
PREFILLED_SYRINGE | INTRAVENOUS | Status: DC | PRN
  Administered 2022-03-17 (×2): 100 mg via INTRAVENOUS

## 2022-03-17 MED ORDER — GABAPENTIN 300 MG PO CAPS: 300.0000 mg | ORAL_CAPSULE | Freq: Three times a day (TID) | ORAL | 0 refills | Status: DC

## 2022-03-17 MED ORDER — MIDAZOLAM HCL 2 MG/2ML IJ SOLN
INTRAMUSCULAR | Status: AC
  Administered 2022-03-17: 1 mg via INTRAVENOUS
  Filled 2022-03-17: qty 2

## 2022-03-17 MED ORDER — BUPIVACAINE LIPOSOME 1.3 % IJ SUSP
INTRAMUSCULAR | Status: AC
  Filled 2022-03-17: qty 10

## 2022-03-17 MED ORDER — FENTANYL CITRATE (PF) 100 MCG/2ML IJ SOLN: 25.0000 ug | INTRAMUSCULAR | Status: DC | PRN

## 2022-03-17 MED ORDER — ONDANSETRON HCL 4 MG/2ML IJ SOLN
INTRAMUSCULAR | Status: DC | PRN
  Administered 2022-03-17: 4 mg via INTRAVENOUS

## 2022-03-17 MED ORDER — PROMETHAZINE HCL 25 MG/ML IJ SOLN: 6.2500 mg | INTRAMUSCULAR | Status: DC | PRN

## 2022-03-17 MED ORDER — FENTANYL CITRATE PF 50 MCG/ML IJ SOSY
PREFILLED_SYRINGE | INTRAMUSCULAR | Status: AC
  Administered 2022-03-17: 50 ug via INTRAVENOUS
  Filled 2022-03-17: qty 1

## 2022-03-17 MED ORDER — DROPERIDOL 2.5 MG/ML IJ SOLN: 0.6250 mg | Freq: Once | INTRAMUSCULAR | Status: DC | PRN

## 2022-03-17 MED ORDER — PROPOFOL 1000 MG/100ML IV EMUL
INTRAVENOUS | Status: AC
  Filled 2022-03-17: qty 100

## 2022-03-17 MED ORDER — OXYCODONE HCL 5 MG/5ML PO SOLN: 5.0000 mg | Freq: Once | ORAL | Status: DC | PRN

## 2022-03-17 MED ORDER — BUPIVACAINE LIPOSOME 1.3 % IJ SUSP
INTRAMUSCULAR | Status: DC | PRN
  Administered 2022-03-17: 10 mL via PERINEURAL

## 2022-03-17 MED ORDER — MIDAZOLAM HCL 2 MG/2ML IJ SOLN
1.0000 mg | Freq: Once | INTRAMUSCULAR | Status: AC
  Administered 2022-03-17: 1 mg via INTRAVENOUS

## 2022-03-17 MED ORDER — FAMOTIDINE 20 MG PO TABS
ORAL_TABLET | ORAL | Status: AC
  Administered 2022-03-17: 20 mg via ORAL
  Filled 2022-03-17: qty 1

## 2022-03-17 MED ORDER — ENOXAPARIN SODIUM 40 MG/0.4ML IJ SOSY: 40.0000 mg | PREFILLED_SYRINGE | INTRAMUSCULAR | 0 refills | Status: DC

## 2022-03-17 MED ORDER — BUPIVACAINE HCL (PF) 0.5 % IJ SOLN
INTRAMUSCULAR | Status: DC | PRN
  Administered 2022-03-17: 20 mL via PERINEURAL
  Administered 2022-03-17: 10 mL via PERINEURAL

## 2022-03-17 MED ORDER — BUPIVACAINE-EPINEPHRINE (PF) 0.5% -1:200000 IJ SOLN
INTRAMUSCULAR | Status: AC
  Filled 2022-03-17: qty 30

## 2022-03-17 MED ORDER — ACETAMINOPHEN 500 MG PO TABS: 1000.0000 mg | ORAL_TABLET | Freq: Four times a day (QID) | ORAL | 0 refills | Status: AC | PRN

## 2022-03-17 MED ORDER — PROPOFOL 10 MG/ML IV BOLUS
INTRAVENOUS | Status: DC | PRN
  Administered 2022-03-17: 150 mg via INTRAVENOUS
  Administered 2022-03-17: 50 mg via INTRAVENOUS

## 2022-03-17 MED ORDER — PHENYLEPHRINE HCL-NACL 20-0.9 MG/250ML-% IV SOLN
INTRAVENOUS | Status: AC
  Filled 2022-03-17: qty 250

## 2022-03-17 MED ORDER — RIVAROXABAN 10 MG PO TABS: 10.0000 mg | ORAL_TABLET | Freq: Every day | ORAL | 0 refills | Status: DC

## 2022-03-17 SURGICAL SUPPLY — 69 items
BIT DRILL CANN COMP 5.0 (BIT) IMPLANT
BIT DRILL CANN STRT 3.2X5 (BIT) ×1 IMPLANT
BIT DRILL PROFILE 5X5 LG (DRILL) ×1 IMPLANT
BIT DRILL PROFILE 7.0 FT (DRILL) IMPLANT
BLADE MED AGGRESSIVE (BLADE) ×4 IMPLANT
BNDG CMPR 5X6 CHSV STRCH STRL (GAUZE/BANDAGES/DRESSINGS)
BNDG COHESIVE 6X5 TAN ST LF (GAUZE/BANDAGES/DRESSINGS) ×3 IMPLANT
BNDG ELASTIC 4X5.8 VLCR STR LF (GAUZE/BANDAGES/DRESSINGS) ×4 IMPLANT
BNDG ELASTIC 6X5.8 VLCR STR LF (GAUZE/BANDAGES/DRESSINGS) ×4 IMPLANT
BNDG ESMARCH 4 X 12 STRL LF (GAUZE/BANDAGES/DRESSINGS) ×3
BNDG ESMARCH 4X12 STRL LF (GAUZE/BANDAGES/DRESSINGS) ×4 IMPLANT
BNDG GAUZE DERMACEA FLUFF 4 (GAUZE/BANDAGES/DRESSINGS) ×4 IMPLANT
BNDG GZE DERMACEA 4 6PLY (GAUZE/BANDAGES/DRESSINGS) ×3
BUR MIS STRT 3.1X20 (BUR) ×1 IMPLANT
BURR MIS STRT 3.1X20 (BUR) ×3
BURR SHAVER STRAIGHT 3.1X20 (BUR) ×3
COVER LIGHT HANDLE STERIS (MISCELLANEOUS) ×4 IMPLANT
DRAPE FLUOR MINI C-ARM 54X84 (DRAPES) ×4 IMPLANT
DRAPE U-SHAPE 47X51 STRL (DRAPES) ×4 IMPLANT
DRILL PROFILE 5X5 LG (DRILL) ×3
DRILL PROFILE 7.0 FT (DRILL)
DRSG TEGADERM 4X10 (GAUZE/BANDAGES/DRESSINGS) ×1 IMPLANT
DURAPREP 26ML APPLICATOR (WOUND CARE) ×4 IMPLANT
GAUZE PAD ABD 8X10 STRL (GAUZE/BANDAGES/DRESSINGS) ×3 IMPLANT
GAUZE SPONGE 4X4 12PLY STRL (GAUZE/BANDAGES/DRESSINGS) ×1 IMPLANT
GAUZE XEROFORM 1X8 LF (GAUZE/BANDAGES/DRESSINGS) ×4 IMPLANT
GLOVE SURG ORTHO 7.0 STRL STRW (GLOVE) ×4 IMPLANT
GUIDEWIRE TROC TIP .062X9.25 (WIRE) ×3 IMPLANT
GUIDEWIRE W/TRCR TIP 2.4X9.25 (WIRE) IMPLANT
IMPL STAPLE SUPERMX 18X15 (Staple) ×1 IMPLANT
IMPLANT STAPLE SUPERMX 18X15 (Staple) ×3 IMPLANT
Irrigation Tubing Set 3.8m ×1 IMPLANT
NEEDLE HYPO 22GX1.5 SAFETY (NEEDLE) ×4 IMPLANT
NS IRRIG 1000ML POUR BTL (IV SOLUTION) ×3 IMPLANT
NS IRRIG 500ML POUR BTL (IV SOLUTION) ×1 IMPLANT
PACK BASIN MAJOR ARMC (MISCELLANEOUS) ×4 IMPLANT
PACK EXTREMITY ARMC (MISCELLANEOUS) ×4 IMPLANT
PAD ABD DERMACEA PRESS 5X9 (GAUZE/BANDAGES/DRESSINGS) ×2 IMPLANT
PAD CAST 3X4 CTTN HI CHSV (CAST SUPPLIES) ×2 IMPLANT
PADDING CAST ABS COTTON 4X4 ST (CAST SUPPLIES) ×2 IMPLANT
PADDING CAST COTTON 3X4 STRL (CAST SUPPLIES) ×6
PENCIL SMOKE EVACUATOR (MISCELLANEOUS) ×3 IMPLANT
RASP SM TEAR CROSS CUT (RASP) ×3 IMPLANT
SCREW LOW COMP HEADLESS 5.0X50 (Screw) ×2 IMPLANT
SET IRRIGATION 3.8 (SET/KITS/TRAYS/PACK) ×1 IMPLANT
SPIKE FLUID TRANSFER (MISCELLANEOUS) ×3 IMPLANT
SPLINT CAST 1 STEP 4X15 (MISCELLANEOUS) ×1 IMPLANT
SPLINT CAST 1 STEP 4X30 (MISCELLANEOUS) ×1 IMPLANT
SPONGE T-LAP 4X18 ~~LOC~~+RFID (SPONGE) ×3 IMPLANT
STAPLER VISISTAT 35W (STAPLE) ×4 IMPLANT
STOCKINETTE STRL 6IN 960660 (GAUZE/BANDAGES/DRESSINGS) ×1 IMPLANT
STRIP CLOSURE SKIN 1/2X4 (GAUZE/BANDAGES/DRESSINGS) ×3 IMPLANT
SUCTION FRAZIER HANDLE 10FR (MISCELLANEOUS) ×3
SUCTION TUBE FRAZIER 10FR DISP (MISCELLANEOUS) ×4 IMPLANT
SUT ETHIBOND 2-0 (SUTURE) ×1 IMPLANT
SUT ETHILON 3-0 (SUTURE) ×5 IMPLANT
SUT ETHILON 3-0 FS-10 30 BLK (SUTURE) ×3
SUT ETHILON NAB PS2 4-0 18IN (SUTURE) ×3 IMPLANT
SUT MNCRL 3-0 UNDYED SH (SUTURE) ×1 IMPLANT
SUT MON AB 3-0 SH 27 (SUTURE) ×4 IMPLANT
SUT MONOCRYL 3-0 UNDYED (SUTURE) ×3
SUT VIC AB 3-0 PS2 18 (SUTURE) ×4 IMPLANT
SUT VICRYL 4-0  27 PS-2 BARIAT (SUTURE)
SUT VICRYL 4-0 27 PS-2 BARIAT (SUTURE)
SUTURE EHLN 3-0 FS-10 30 BLK (SUTURE) ×1 IMPLANT
SUTURE VICRYL 4-0 27 PS-2 BART (SUTURE) ×3 IMPLANT
SYR CONTROL 10ML LL (SYRINGE) ×4 IMPLANT
TOWEL OR 17X26 4PK STRL BLUE (TOWEL DISPOSABLE) ×3 IMPLANT
TRAP FLUID SMOKE EVACUATOR (MISCELLANEOUS) ×4 IMPLANT

## 2022-03-17 NOTE — Anesthesia Procedure Notes (Signed)
Anesthesia Regional Block: Adductor canal block   Pre-Anesthetic Checklist: , timeout performed,  Correct Patient, Correct Site, Correct Laterality,  Correct Procedure, Correct Position, site marked,  Risks and benefits discussed,  Surgical consent,  Pre-op evaluation,  At surgeon's request and post-op pain management  Laterality: Right  Prep: chloraprep       Needles:  Injection technique: Single-shot  Needle Type: Stimiplex     Needle Length: 9cm  Needle Gauge: 22     Additional Needles:   Procedures:,,,, ultrasound used (permanent image in chart),,    Narrative:  Start time: 03/17/2022 7:35 AM End time: 03/17/2022 7:40 AM Injection made incrementally with aspirations every 5 mL.  Performed by: Personally  Anesthesiologist: Iran Ouch, MD  Additional Notes: Patient consented for risk and benefits of nerve block including but not limited to nerve damage, Horner's syndrome, failed block, bleeding and infection.  Patient voiced understanding.  Functioning IV was confirmed and monitors were applied.  Timeout done prior to procedure and prior to any sedation being given to the patient.  Patient confirmed procedure site prior to any sedation given to the patient.  A 80mm 22ga Stimuplex needle was used. Sterile prep,hand hygiene and sterile gloves were used.  Minimal sedation used for procedure.  No paresthesia endorsed by patient during the procedure.  Negative aspiration and negative test dose prior to incremental administration of local anesthetic. The patient tolerated the procedure well with no immediate complications.

## 2022-03-17 NOTE — Transfer of Care (Signed)
Immediate Anesthesia Transfer of Care Note  Patient: Justin Duran  Procedure(s) Performed: BASE WEDGE OSTEOTOMY (Right: Toe) DIVISION OF PLANTAR FASCIA & MUSCLE (Right: Foot) MIDFOOT W/TENDON LENGTHENING & TENDON TRANSFER (Right: Foot) CALCANEAL OSTEOTOMY (Right: Foot)  Patient Location: PACU  Anesthesia Type:General  Level of Consciousness: awake, alert , and oriented  Airway & Oxygen Therapy: Patient Spontanous Breathing and Patient connected to nasal cannula oxygen  Post-op Assessment: Report given to RN and Post -op Vital signs reviewed and stable  Post vital signs: Reviewed  Last Vitals:  Vitals Value Taken Time  BP 124/81 03/17/22 1042  Temp    Pulse 92 03/17/22 1045  Resp 19 03/17/22 1045  SpO2 96 % 03/17/22 1045  Vitals shown include unvalidated device data.  Last Pain:  Vitals:   03/17/22 0629  PainSc: 0-No pain         Complications: No notable events documented.

## 2022-03-17 NOTE — Anesthesia Procedure Notes (Signed)
Procedure Name: Intubation Date/Time: 03/17/2022 8:04 AM  Performed by: Patience Musca., CRNAPre-anesthesia Checklist: Patient identified, Patient being monitored, Timeout performed, Emergency Drugs available and Suction available Patient Re-evaluated:Patient Re-evaluated prior to induction Oxygen Delivery Method: Circle system utilized Preoxygenation: Pre-oxygenation with 100% oxygen Induction Type: IV induction Ventilation: Mask ventilation without difficulty Laryngoscope Size: McGraph and 4 Grade View: Grade I Tube type: Oral Tube size: 7.5 mm Number of attempts: 1 Airway Equipment and Method: Stylet Placement Confirmation: ETT inserted through vocal cords under direct vision, positive ETCO2 and breath sounds checked- equal and bilateral Secured at: 23 cm Tube secured with: Tape Dental Injury: Teeth and Oropharynx as per pre-operative assessment

## 2022-03-17 NOTE — Brief Op Note (Signed)
03/17/2022  10:36 AM  PATIENT:  Justin Duran  22 y.o. male  PRE-OPERATIVE DIAGNOSIS:  Congenital pes cavus, right foot Peroneal tendinosis, right Acquired cavovarus deformity of foot, right Equinus contracture of right ankle Plantar flexed metatarsal bone of right foot Achilles tendinitis  POST-OPERATIVE DIAGNOSIS:  Congenital pes cavus, right footPeroneal tendinosis, rightAcquired cavovarus deformity of foot, rightEquinus contracture of right anklePlantar flexed metatarsal bone of right footAchilles tendinitis  PROCEDURE:  Procedure(s) with comments: BASE WEDGE OSTEOTOMY (Right) - Dorsiflexion wedge osteotomy DIVISION OF PLANTAR FASCIA & MUSCLE (Right) - Steindler Stripping MIDFOOT W/TENDON LENGTHENING & TENDON TRANSFER (Right) - PT tendon lengthening; peroneus longus to brevis transfer CALCANEAL OSTEOTOMY (Right) - Lateral calcaneal displacement osteotomy  SURGEON:  Surgeon(s) and Role:    * Jacqualyn Sedgwick, Stephan Minister, DPM - Primary    * Felipa Furnace, DPM  PHYSICIAN ASSISTANT:   ASSISTANTS: Boneta Lucks DPM   ANESTHESIA:   regional and general  EBL:  10cc   BLOOD ADMINISTERED:none  DRAINS: none   LOCAL MEDICATIONS USED:  NONE  SPECIMEN:  No Specimen  DISPOSITION OF SPECIMEN:  N/A  COUNTS:  YES  TOURNIQUET:   Total Tourniquet Time Documented: Thigh (Right) - 120 minutes Total: Thigh (Right) - 120 minutes   DICTATION: .Note written in EPIC  PLAN OF CARE: Discharge to home after PACU  PATIENT DISPOSITION:  PACU - hemodynamically stable.   Delay start of Pharmacological VTE agent (>24hrs) due to surgical blood loss or risk of bleeding: no  NWB to RLE Start xarelto tonight Ice and elevate at home

## 2022-03-17 NOTE — Discharge Instructions (Addendum)
Post-Surgery Instructions  1. If you are recuperating from surgery anywhere other than home, please be sure to leave [place] a number where you can be reached. 2. Go directly home and rest. 3. The keep operated foot (or feet) elevated six inches above the hip when sitting or lying down. 4. Support the elevated foot and leg with pillows under the calf. DO NOT PLACE PILLOWS UNDER THE KNEE. 5. DO NOT REMOVE or get your bandages wet. This will increase your chances of getting an infection. 6. Wear your surgical shoe at all times when you are up. 7. A limited amount of pain and swelling may occur. The skin may take on a bruised appearance. This is no cause for alarm. 8. For slight pain and swelling, apply an ice pack behind the knee for 15 minutes every hour. Continue icing until seen in the office. DO NOT apply any form of heat to the area. 9. Have prescription(s) filled immediately and take as directed. 10. Drink lots of liquids, water, and juice. 11. CALL THE OFFICE IMMEDIATELY IF: a. Bleeding continues b. Pain increases and/or does not respond to medication c. Bandage or cast appears too tight d. Any liquids (water, coffee, etc.) have spilled on your bandages. e. Tripping, falling, or stubbing the surgical foot f. If your temperature rises above 101 g. If you have ANY questions at all 12. Please use the crutches, knee scooter, or walker you have prescribed, rented, or purchased. If you are non-weight bearing DO NOT put weight on the operated foot for _________ days. If you are weight-bearing, follow your physician's instructions. You are expected to be:   ? non-weight bearing 13. Special Instructions: _____________________________________________________________ _________________________________________________________________________________ _________________________________________________________________________________  14. Your next appointment is: 03/23/2022 1:15 PM with Dr  Posey Pronto  If you need to reach the nurse for any reason, please call: [place]: [phone] [place]: [phone] [place]: [phone]   AMBULATORY SURGERY  DISCHARGE INSTRUCTIONS   The drugs that you were given will stay in your system until tomorrow so for the next 24 hours you should not:  Drive an automobile Make any legal decisions Drink any alcoholic beverage   You may resume regular meals tomorrow.  Today it is better to start with liquids and gradually work up to solid foods.  You may eat anything you prefer, but it is better to start with liquids, then soup and crackers, and gradually work up to solid foods.   Please notify your doctor immediately if you have any unusual bleeding, trouble breathing, redness and pain at the surgery site, drainage, fever, or pain not relieved by medication.    Additional Instructions:        Please contact your physician with any problems or Same Day Surgery at [phone], Monday through Friday 6 am to 4 pm, or [place] at [facility] number at [phone].    Interscalene Nerve Block with Exparel   For your surgery you have received an Interscalene Nerve Block with Exparel. Nerve Blocks affect many types of nerves, including nerves that control movement, pain and normal sensation.  You may experience feelings such as numbness, tingling, heaviness, weakness or the inability to move your arm or the feeling or sensation that your arm has "fallen asleep". A nerve block with Exparel can last up to 5 days.  Usually the weakness wears off first.  The tingling and heaviness usually wear off next.  Finally you may start to notice pain.  Keep in mind that this may occur  in any order.  Once a nerve block starts to wear off it is usually completely gone within 60 minutes. ISNB may cause mild shortness of breath, a hoarse voice, blurry vision, unequal pupils, or drooping of the face on the same side as the nerve  block.  These symptoms will usually resolve with the numbness.  Very rarely the procedure itself can cause mild seizures. If needed, your surgeon will give you a prescription for pain medication.  It will take about 60 minutes for the oral pain medication to become fully effective.  So, it is recommended that you start taking this medication before the nerve block first begins to wear off, or when you first begin to feel discomfort. Take your pain medication only as prescribed.  Pain medication can cause sedation and decrease your breathing if you take more than you need for the level of pain that you have. Nausea is a common side effect of many pain medications.  You may want to eat something before taking your pain medicine to prevent nausea. After an Interscalene nerve block, you cannot feel pain, pressure or extremes in temperature in the effected arm.  Because your arm is numb it is at an increased risk for injury.  To decrease the possibility of injury, please practice the following:  While you are awake change the position of your arm frequently to prevent too much pressure on any one area for prolonged periods of time.  If you have a cast or tight dressing, check the color or your fingers every couple of hours.  Call your surgeon with the appearance of any discoloration (white or blue). If you are given a sling to wear before you go home, please wear it  at all times until the block has completely worn off.  Do not get up at night without your sling. Please contact [facility] or your surgeon if you do not begin to regain sensation after 7 days from the surgery.  Anesthesia may be contacted by calling the Same Day Surgery Department, Mon. through Fri., 6 am to 4 pm at [phone].   If you experience any other problems or concerns, please contact your surgeon's office. If you experience severe or prolonged shortness of breath go to the nearest emergency department.

## 2022-03-17 NOTE — H&P (Signed)
History and Physical Interval Note:  03/17/2022 7:35 AM  Justin Duran  has presented today for surgery, with the diagnosis of pes cavus.  The various methods of treatment have been discussed with the patient and family. After consideration of risks, benefits and other options for treatment, the patient has consented to   Procedure(s): TENDO ACHILLES LENGTHENING WITH TENODESIS (Right) BASE WEDGE OSTEOTOMY (Right) DIVISION OF PLANTAR FASCIA & MUSCLE (Right) CAPSULOTOMY,MIDFOOT W/TENDON LENGTHENING (Right) EVAN CALCANEAL OSTEOTOMY (Right) as a surgical intervention.  The patient's history has been reviewed, patient examined, no change in status, stable for surgery.  I have reviewed the patient's chart and labs.  Questions were answered to the patient's satisfaction.     Criselda Peaches

## 2022-03-17 NOTE — Anesthesia Procedure Notes (Signed)
Anesthesia Regional Block: Popliteal block   Pre-Anesthetic Checklist: , timeout performed,  Correct Patient, Correct Site, Correct Laterality,  Correct Procedure, Correct Position, site marked,  Risks and benefits discussed,  Surgical consent,  Pre-op evaluation,  At surgeon's request and post-op pain management  Laterality: Right  Prep: chloraprep       Needles:  Injection technique: Single-shot  Needle Type: Stimiplex     Needle Length: 9cm  Needle Gauge: 22     Additional Needles:   Procedures:,,,, ultrasound used (permanent image in chart),,    Narrative:  Start time: 03/17/2022 7:28 AM End time: 03/17/2022 7:34 AM Injection made incrementally with aspirations every 5 mL.  Performed by: Personally  Anesthesiologist: [place] Ouch, MD  Additional Notes: Patient consented for risk and benefits of nerve block including but not limited to nerve damage, failed block, bleeding and infection.  Patient voiced understanding.  Functioning IV was confirmed and monitors were applied.  Timeout done prior to procedure and prior to any sedation being given to the patient.  Patient confirmed procedure site prior to any sedation given to the patient. Sterile prep,hand hygiene and sterile gloves were used.  Minimal sedation used for procedure.  No paresthesia endorsed by patient during the procedure.  Negative aspiration and negative test dose prior to incremental administration of local anesthetic. The patient tolerated the procedure well with no immediate complications.

## 2022-03-17 NOTE — Anesthesia Preprocedure Evaluation (Addendum)
Anesthesia Evaluation  Patient identified by MRN, date of birth, ID band Patient awake    Reviewed: Allergy & Precautions, H&P , NPO status , Patient's Chart, lab work & pertinent test results  Airway Mallampati: II  TM Distance: >3 FB Neck ROM: full    Dental no notable dental hx.    Pulmonary Current Smoker   Pulmonary exam normal        Cardiovascular Normal cardiovascular exam  Pes cavus, congenital   Neuro/Psych negative neurological ROS  negative psych ROS   GI/Hepatic negative GI ROS,,,(+)     substance abuse  marijuana use  Endo/Other  negative endocrine ROS    Renal/GU      Musculoskeletal   Abdominal  (+) + obese  Peds  Hematology negative hematology ROS (+)   Anesthesia Other Findings   Past Surgical History: No date: CARDIAC SURGERY No date: CIRCUMCISION 12/22/1998: HERNIA REPAIR     Comment:  Performed at [place]  BMI    Body Mass Index: 33.47 kg/m      Reproductive/Obstetrics negative OB ROS                             Anesthesia Physical Anesthesia Plan  ASA: 2  Anesthesia Plan: General   Post-op Pain Management: Regional block*   Induction: Intravenous  PONV Risk Score and Plan: Dexamethasone, Ondansetron and Midazolam  Airway Management Planned: Oral ETT  Additional Equipment:   Intra-op Plan:   Post-operative Plan: Extubation in OR  Informed Consent: I have reviewed the patients History and Physical, chart, labs and discussed the procedure including the risks, benefits and alternatives for the proposed anesthesia with the patient or authorized representative who has indicated his/her understanding and acceptance.     Dental Advisory Given  Plan Discussed with: Anesthesiologist, CRNA and Surgeon  Anesthesia Plan Comments:         Anesthesia Quick Evaluation

## 2022-03-17 NOTE — Telephone Encounter (Signed)
Patient's cousin(Katie)called and are unable to pick up the Xarelto.I called pharmacy and the Xarelto is too expensive(300.00)insurance will not cover, can another blood thinner be sent?

## 2022-03-18 DIAGNOSIS — Q6671 Congenital pes cavus, right foot: Secondary | ICD-10-CM

## 2022-03-18 DIAGNOSIS — M216X1 Other acquired deformities of right foot: Secondary | ICD-10-CM

## 2022-03-18 DIAGNOSIS — M21171 Varus deformity, not elsewhere classified, right ankle: Secondary | ICD-10-CM

## 2022-03-18 DIAGNOSIS — M6240 Contracture of muscle, unspecified site: Secondary | ICD-10-CM

## 2022-03-18 NOTE — Op Note (Signed)
Patient Name: [place] DOB: May 28, 2000  MRN: [id number]   Date of Service: 03/17/2022  Surgeon: Dr. Lanae Crumbly, DPM Assistants: Dr. Boneta Lucks, D.P.M. Pre-operative Diagnosis:  Pes cavovarus deformity right foot Contracture of tendon right ankle Plantarflexed metatarsal right foot Post-operative Diagnosis:  Pes cavovarus deformity right foot Contracture of tendon right ankle Plantarflexed metatarsal right foot Procedures:  1) calcaneal osteotomy lateral and dorsal displacement    2) peroneus longus to brevis transfer  3) lengthening of posterior tibial tendon  4) dorsiflexion wedge osteotomy first metatarsal  5) Steindler stripping Pathology/Specimens: * No specimens in log * Anesthesia: General with regional block Hemostasis:  Total Tourniquet Time Documented: Thigh (Right) - 120 minutes Total: Thigh (Right) - 120 minutes  Estimated Blood Loss: 10 cc Materials:  Implant Name Type Inv. Item Serial No. Manufacturer Lot No. LRB No. Used Action  IMPLANT STAPLE SUPERMX 18X15 - [id number] Staple IMPLANT STAPLE SUPERMX 18X15  [employer] [id number] Right 1 Implanted  SCREW LOW COMP HEADLESS 5.0X50 - [id number] Screw SCREW LOW COMP HEADLESS 5.0X50  [employer]  Right 2 Implanted   Medications: No medications given Complications: No complication noted  Indications for Procedure:  This is a 22 y.o. male with a history of pes cavovarus deformity of the right foot.  After failing nonsurgical treatment he elected for operative intervention All risks, benefits and potential complications discussed prior to the procedure. All questions addressed. Informed consent signed and reviewed.      Procedure in Detail: Patient was identified in pre-operative holding area. Formal consent was signed and the right lower extremity was marked. Patient was brought back to the operating room. Anesthesia was induced. The extremity was prepped and draped in the usual sterile fashion. Timeout  was taken to confirm patient name, laterality, and procedure prior to incision.   Attention was then directed to the right foot where I began by making an incision on the medial heel at the glabrous junction.  Dissection was carried deep to subcutaneous tissue, cauterizing bleeding vessels as necessary.  The plantar fascia proximal attachment to the calcaneal tuber was identified and blunt dissection was used to elevate the soft tissues across the entire width of the calcaneus.  Utilizing a curved Mayo scissor and key elevator the proximal attachment of the plantar fascia medial central and lateral bands as well as the bony attachments of the flexor digitorum muscle belly were stripped from the calcaneal tuber to help release the anterior cavus contracture of the plantar arch.  I then directed my attention to the posterior medial calf, an incision was made at the posterior border of the tibia and dissection was carried deep to subcutaneous tissue, cauterizing bleeding vessels as necessary.  The deep fascia was incised and the tendon sheath of the posterior tibialis tendon and aponeurosis of muscle belly was identified and entered.  The aponeurosis and tendon was incised with a scalpel and the posterior tibial tendon was lengthened appropriately to reduce the contracture and equinovarus contracture at the ankle.  I then directed my attention to the lateral foot and heel where an incision was made proximal to the fifth metatarsal base.  An incision was made here and dissection carried deep through subcutaneous tissue, cauterizing bleeding vessels as necessary.  The sural nerve was identified and protected.  The peroneal tendon sheaths were identified and entered and the divisions between the 2 separate sheaths was transected to create a single sheath.  The peroneus longus was traced as far distal as possible  and released.  The peroneus longus tendon was then tenodesed in a side to end and side-to-side manner  utilizing 2-0 Ethibond suture to function as a single unit.  The singular sheath was then closed over this.  Under live fluoroscopic guidance I then performed a stress examination of the lateral ankle ligaments which were intact both in eversion stress and anterior drawer examination.  I then directed my attention to the dorsal first metatarsal base where an incision was made.  Dissection was carried deep to subcutaneous tissue, the extensor hallucis longus tendon was identified and retracted and a periosteal incision was made.  An anterior and dorsally based wedge osteotomy was then created with a sagittal saw leaving the plantar hinge intact.  The saw was used to further the osteotomy while closure and reduction of the osteotomy was completed.  This was temporarily fixated with a Kirschner wire.  The osteotomy was then fixated with a compression staple with good stability noted.  I then directed my attention to the posterior lateral heel where an axis guide for an osteotomy of the calcaneus was created over the skin as well as screw trajectories.  And a small incision was made and a blunt elevator was used to raise the soft tissues over the lateral calcaneal dorsal and plantar surfaces.  Utilizing the Arthrex burr the calcaneal osteotomy was then created.  The posterior tuber was then translated laterally and dorsally to reduce the deformity.  Once satisfactory position was achieved and confirmed with fluoroscopy guidewires for the 5.0 mm fully threaded compression screws were then placed.  These were inserted with good compression and stability of the osteotomy noted.  Final radiographs were taken.  All incisions were then thoroughly irrigated and closed in layers using 3-0 Vicryl, 3-0 Monocryl, 3-0 nylon. The foot was then dressed with Xeroform dry sterile dressings and a well-padded posterior below the knee and saddle splint. Patient tolerated the procedure well.  Dr. Boneta Lucks, D.P.M. served as my  skilled assistant during the procedure.  Due to the extensive and complex nature of this procedure his presence and skilled assistance was critical to an expedient and successful outcome of the surgery.   Disposition: Following a period of post-operative monitoring, patient will be transferred to home.

## 2022-03-20 ENCOUNTER — Encounter: Payer: Self-pay | Admitting: Podiatry

## 2022-03-20 NOTE — Anesthesia Postprocedure Evaluation (Signed)
Anesthesia Post Note  Patient: Justin Duran  Procedure(s) Performed: BASE WEDGE OSTEOTOMY (Right: Toe) DIVISION OF PLANTAR FASCIA & MUSCLE (Right: Foot) MIDFOOT W/TENDON LENGTHENING & TENDON TRANSFER (Right: Foot) CALCANEAL OSTEOTOMY (Right: Foot)  Patient location during evaluation: PACU Anesthesia Type: General Level of consciousness: awake and alert Pain management: pain level controlled Vital Signs Assessment: post-procedure vital signs reviewed and stable Respiratory status: spontaneous breathing, nonlabored ventilation and respiratory function stable Cardiovascular status: blood pressure returned to baseline and stable Postop Assessment: no apparent nausea or vomiting Anesthetic complications: no   There were no known notable events for this encounter.   Last Vitals:  Vitals:   03/17/22 1115 03/17/22 1254  BP: 120/75 130/83  Pulse: 75 68  Resp: 16 18  Temp: (!) 36.1 C (!) 36.3 C  SpO2: 96% 98%    Last Pain:  Vitals:   03/17/22 1254  TempSrc: Oral  PainSc: 2                  [place] Ouch

## 2022-03-21 ENCOUNTER — Telehealth: Payer: Self-pay | Admitting: *Deleted

## 2022-03-21 NOTE — Telephone Encounter (Signed)
Patient is calling because his medication(gabapentin) is causing him to hallucinate, has stopped taking , please advise.

## 2022-03-22 ENCOUNTER — Encounter: Payer: Self-pay | Admitting: Podiatry

## 2022-03-22 NOTE — Telephone Encounter (Signed)
Patient did pick up prescription for levonox, doing well with it ,no problems

## 2022-03-23 ENCOUNTER — Ambulatory Visit (INDEPENDENT_AMBULATORY_CARE_PROVIDER_SITE_OTHER): Payer: Medicaid Other | Admitting: Podiatry

## 2022-03-23 ENCOUNTER — Other Ambulatory Visit: Payer: Self-pay

## 2022-03-23 ENCOUNTER — Telehealth: Payer: Self-pay

## 2022-03-23 ENCOUNTER — Ambulatory Visit (INDEPENDENT_AMBULATORY_CARE_PROVIDER_SITE_OTHER): Payer: Medicaid Other

## 2022-03-23 DIAGNOSIS — Q6671 Congenital pes cavus, right foot: Secondary | ICD-10-CM

## 2022-03-23 DIAGNOSIS — Z9889 Other specified postprocedural states: Secondary | ICD-10-CM

## 2022-03-23 DIAGNOSIS — M216X1 Other acquired deformities of right foot: Secondary | ICD-10-CM

## 2022-03-23 DIAGNOSIS — Z419 Encounter for procedure for purposes other than remedying health state, unspecified: Secondary | ICD-10-CM | POA: Diagnosis not present

## 2022-03-23 DIAGNOSIS — M24571 Contracture, right ankle: Secondary | ICD-10-CM

## 2022-03-23 DIAGNOSIS — M6788 Other specified disorders of synovium and tendon, other site: Secondary | ICD-10-CM

## 2022-03-23 NOTE — Telephone Encounter (Signed)
Bid or qd for 10 days

## 2022-03-23 NOTE — Progress Notes (Signed)
  Subjective:  Patient ID: Justin Duran, male    DOB: 2000-09-08,  MRN: [id number]  Chief Complaint  Patient presents with   Routine Post Op    POV #1 DOS 03/17/2022 RT FOOT CAVUS RECONSTRUCTION W/ACHILLES LENGTHENING, TENDON TRANSFER, BONE CUT IN HEEL AND 1ST METATARSAL , STEINDLER STRIPPING/     22 y.o. male returns for post-op check.  Patient states is doing okay.  Did not have to take any pain medication.  Incisions are healing well.  Bandages clean dry and intact nonweightbearing with a knee scooter  Review of Systems: Negative except as noted in the HPI. Denies N/V/F/Ch.  No past medical history on file.  Current Outpatient Medications:    acetaminophen (TYLENOL) 500 MG tablet, Take 2 tablets (1,000 mg total) by mouth every 6 (six) hours as needed for up to 14 days (pain)., Disp: 112 tablet, Rfl: 0   enoxaparin (LOVENOX) 40 MG/0.4ML injection, Inject 0.4 mLs (40 mg total) into the skin daily., Disp: 12 mL, Rfl: 0   gabapentin (NEURONTIN) 300 MG capsule, Take 1 capsule (300 mg total) by mouth 3 (three) times daily for 7 days., Disp: 21 capsule, Rfl: 0   oxyCODONE (OXY IR/ROXICODONE) 5 MG immediate release tablet, Take 1 tablet (5 mg total) by mouth every 4 (four) hours as needed for up to 7 days for severe pain., Disp: 42 tablet, Rfl: 0  Social History   Tobacco Use  Smoking Status Every Day   Types: E-cigarettes  Smokeless Tobacco Never    No Known Allergies Objective:  There were no vitals filed for this visit. There is no height or weight on file to calculate BMI. Constitutional Well developed. Well nourished.  Vascular Foot warm and well perfused. Capillary refill normal to all digits.   Neurologic Normal speech. Oriented to person, place, and time. Epicritic sensation to light touch grossly present bilaterally.  Dermatologic Skin healing well without signs of infection. Skin edges well coapted without signs of infection.  Orthopedic: Tenderness to palpation  noted about the surgical site.   Radiographs: 3 views of skeletally mature adult right foot: Good correction alignment noted hardware is intact.  No signs of backing out or loosening noted. Assessment:   1. Congenital pes cavus, right foot   2. Peroneal tendinosis, right   3. Acquired cavovarus deformity of foot, right   4. Equinus contracture of right ankle   5. Status post foot surgery    Plan:  Patient was evaluated and treated and all questions answered.  S/p foot surgery right -Progressing as expected post-operatively. -XR: See above -WB Status: Nonweightbearing in right lower extremity with knee scooter -Sutures: Intact signs of Deis is noted no complication noted. -Medications: Dry dressing with posterior splint nonweightbearing -Foot redressed.  No follow-ups on file.

## 2022-03-25 MED ORDER — DOXYCYCLINE HYCLATE 100 MG PO TABS: 100.0000 mg | ORAL_TABLET | Freq: Two times a day (BID) | ORAL | 0 refills | Status: DC

## 2022-03-25 NOTE — Addendum Note (Signed)
Addended by: Boneta Lucks on: 03/25/2022 04:12 PM   Modules accepted: Orders

## 2022-04-06 ENCOUNTER — Ambulatory Visit (INDEPENDENT_AMBULATORY_CARE_PROVIDER_SITE_OTHER): Payer: Medicaid Other | Admitting: Podiatry

## 2022-04-06 DIAGNOSIS — M6788 Other specified disorders of synovium and tendon, other site: Secondary | ICD-10-CM

## 2022-04-06 DIAGNOSIS — L89616 Pressure-induced deep tissue damage of right heel: Secondary | ICD-10-CM

## 2022-04-06 DIAGNOSIS — M216X1 Other acquired deformities of right foot: Secondary | ICD-10-CM

## 2022-04-06 DIAGNOSIS — M24571 Contracture, right ankle: Secondary | ICD-10-CM

## 2022-04-06 DIAGNOSIS — Q6671 Congenital pes cavus, right foot: Secondary | ICD-10-CM

## 2022-04-06 MED ORDER — MUPIROCIN 2 % EX OINT: 1.0000 | TOPICAL_OINTMENT | Freq: Two times a day (BID) | CUTANEOUS | 2 refills | Status: DC

## 2022-04-06 NOTE — Progress Notes (Signed)
  Subjective:  Patient ID: Justin Duran, male    DOB: [birth year],  MRN: [id number]  Chief Complaint  Patient presents with   Routine Post Op    POV #2 DOS 03/17/2022 RT FOOT CAVUS RECONSTRUCTION W/ACHILLES LENGTHENING, TENDON TRANSFER, BONE CUT IN HEEL AND 1ST METATARSAL , STEINDLER STRIPPING/DR [name] PT    22 y.o. male returns for post-op check.  He is doing well has had his pain is controlled.  Discussed there is some pain in the heel  Review of Systems: Negative except as noted in the HPI. Denies N/V/F/Ch.   Objective:  There were no vitals filed for this visit. There is no height or weight on file to calculate BMI. Constitutional Well developed. Well nourished.  Vascular Foot warm and well perfused. Capillary refill normal to all digits.  Calf is soft and supple, no posterior calf or knee pain, negative Homans' sign  Neurologic Normal speech. Oriented to person, place, and time. Epicritic sensation to light touch grossly present bilaterally.  Dermatologic Majority of incisions healing well no signs of infection anywhere.  He does appear to have a new deep tissue injury on the plantar posterior heel medial to the incision  Orthopedic: Tenderness to palpation noted about the surgical site.  Moderate edema   Multiple view plain film radiographs: Previous radiographs were reviewed, correction is maintained with no complication of hardware Assessment:   1. Congenital pes cavus, right foot   2. Peroneal tendinosis, right   3. Acquired cavovarus deformity of foot, right   4. Equinus contracture of right ankle   5. Pressure injury of deep tissue of right heel    Plan:  Patient was evaluated and treated and all questions answered.  S/p foot surgery right -WB Status: NWB in removable AFO splint.  Discussed with him he should maintain the splint is much as possible removed twice daily to apply mupirocin ointment which I prescribed to deep tissue injury.  Hopefully more bruising  or ecchymosis than true injury and does not lead to ulceration.  Return to clinic in 1 week for wound evaluation -Sutures: Removed today. -Medications: Rx sent to pharmacy for mupirocin -Foot redressed.  Return in about 1 week (around 04/13/2022) for wound check heel .

## 2022-04-13 ENCOUNTER — Ambulatory Visit (INDEPENDENT_AMBULATORY_CARE_PROVIDER_SITE_OTHER): Payer: Medicaid Other | Admitting: Podiatry

## 2022-04-13 DIAGNOSIS — L89616 Pressure-induced deep tissue damage of right heel: Secondary | ICD-10-CM

## 2022-04-16 NOTE — Progress Notes (Signed)
He returns for follow-up today for evaluation of the posterior heel, there were no signs of infection bruise seems to be waning there is no drainage.  He will continue using the mupirocin ointment and remain nonweightbearing in the splint.  We will see him back in 1 week for follow-up and reevaluation at that time.  Advised on signs and symptoms of infection he will let me know if any of this develops  Lanae Crumbly, DPM 04/16/2022

## 2022-04-19 ENCOUNTER — Ambulatory Visit (INDEPENDENT_AMBULATORY_CARE_PROVIDER_SITE_OTHER): Payer: Medicaid Other | Admitting: Podiatry

## 2022-04-19 ENCOUNTER — Encounter: Payer: Self-pay | Admitting: Podiatry

## 2022-04-19 DIAGNOSIS — L89616 Pressure-induced deep tissue damage of right heel: Secondary | ICD-10-CM

## 2022-04-19 NOTE — Progress Notes (Signed)
He returns for follow-up today for evaluation of the posterior heel, there were no signs of infection, again area seems to be improving, he may continue regular bathing and continue using the mupirocin ointment and remain nonweightbearing in the splint.  We will see him back in 1 week for follow-up and reevaluation at that time.  Advised on signs and symptoms of infection he will let me know if any of this develops  Lanae Crumbly, DPM 04/19/2022

## 2022-04-21 DIAGNOSIS — Z419 Encounter for procedure for purposes other than remedying health state, unspecified: Secondary | ICD-10-CM | POA: Diagnosis not present

## 2022-04-26 ENCOUNTER — Ambulatory Visit (INDEPENDENT_AMBULATORY_CARE_PROVIDER_SITE_OTHER): Payer: Medicaid Other | Admitting: Podiatry

## 2022-04-26 DIAGNOSIS — L89616 Pressure-induced deep tissue damage of right heel: Secondary | ICD-10-CM

## 2022-04-27 ENCOUNTER — Encounter: Payer: Medicaid Other | Admitting: Podiatry

## 2022-04-27 NOTE — Progress Notes (Signed)
He returns for follow-up today for evaluation of the posterior heel, there were no signs of infection, again area seems to be improving and reducing in size the blood blister is resolving, hopeful he will not have residual ulceration, he may continue regular bathing and continue using the mupirocin ointment and remain nonweightbearing in the splint.  Will see him back in 2 weeks for new radiographs, hopefully should be able to debride the peeling skin accelerate his further healing on the posterior portion  Lanae Crumbly, DPM 04/27/2022

## 2022-05-09 ENCOUNTER — Ambulatory Visit (INDEPENDENT_AMBULATORY_CARE_PROVIDER_SITE_OTHER): Payer: Medicaid Other | Admitting: Podiatry

## 2022-05-09 ENCOUNTER — Ambulatory Visit (INDEPENDENT_AMBULATORY_CARE_PROVIDER_SITE_OTHER): Payer: Medicaid Other

## 2022-05-09 DIAGNOSIS — M6788 Other specified disorders of synovium and tendon, other site: Secondary | ICD-10-CM

## 2022-05-09 DIAGNOSIS — M216X1 Other acquired deformities of right foot: Secondary | ICD-10-CM | POA: Diagnosis not present

## 2022-05-09 DIAGNOSIS — L89616 Pressure-induced deep tissue damage of right heel: Secondary | ICD-10-CM

## 2022-05-10 NOTE — Progress Notes (Signed)
  Subjective:  Patient ID: Justin Duran, male    DOB: [birth year],  MRN: [id number]  Chief Complaint  Patient presents with   Routine Post Op    POV #4 DOS 03/17/2022 RT FOOT CAVUS RECONSTRUCTION W/ACHILLES LENGTHENING, TENDON TRANSFER, BONE CUT IN HEEL AND 1ST METATARSAL , STEINDLER STRIPPING    22 y.o. male returns for post-op check.  Says the heel is doing okay.  His pain is improving.  He has been able to bathe the foot.  Review of Systems: Negative except as noted in the HPI. Denies N/V/F/Ch.   Objective:  There were no vitals filed for this visit. There is no height or weight on file to calculate BMI. Constitutional Well developed. Well nourished.  Vascular Foot warm and well perfused. Capillary refill normal to all digits.  Calf is soft and supple, no posterior calf or knee pain, negative Homans' sign  Neurologic Normal speech. Oriented to person, place, and time. Epicritic sensation to light touch grossly present bilaterally.  Dermatologic Incisions are well-healed no signs of infection not hypertrophic.  Posterior heel ulceration deep tissue injury measuring 3.0 x 2.0 x 0.3 cm.  Central eschar.  Healing well on perimeter.  Blood blister resolving.  No signs of infection.  Orthopedic: Edema improving he has minimal pain in the bony areas.      Multiple view plain film radiographs: New radiographs taken today show increasing consolidation and complete healing across first metatarsal osteotomy site Assessment:   1. Acquired cavovarus deformity of foot, right   2. Peroneal tendinosis, right   3. Pressure injury of deep tissue of right heel    Plan:  Patient was evaluated and treated and all questions answered.  S/p foot surgery right -Continues to improve.  Has a ways to go on local wound care for the deep tissue injury on the heel.  I debrided the overlying hyperkeratosis and surrounding areas of nonviable tissue gently with a #312 blade in an excisional manner with  topical lidocaine.  Continue utilizing mupirocin ointment at home daily  Regarding his correction his foot is in improved position.  He will be 8 weeks out from surgery next week.  At that point he may begin to start gradual toe-touch weightbearing utilizing a walker and a CAM boot.  This was dispensed today.  I will see him back in 2 weeks for further wound care  No follow-ups on file.

## 2022-05-23 ENCOUNTER — Ambulatory Visit (INDEPENDENT_AMBULATORY_CARE_PROVIDER_SITE_OTHER): Payer: Medicaid Other

## 2022-05-23 ENCOUNTER — Ambulatory Visit (INDEPENDENT_AMBULATORY_CARE_PROVIDER_SITE_OTHER): Payer: Medicaid Other | Admitting: Podiatry

## 2022-05-23 DIAGNOSIS — M216X1 Other acquired deformities of right foot: Secondary | ICD-10-CM | POA: Diagnosis not present

## 2022-05-23 DIAGNOSIS — Q6671 Congenital pes cavus, right foot: Secondary | ICD-10-CM

## 2022-05-23 DIAGNOSIS — L89616 Pressure-induced deep tissue damage of right heel: Secondary | ICD-10-CM

## 2022-05-23 DIAGNOSIS — S92042A Displaced other fracture of tuberosity of left calcaneus, initial encounter for closed fracture: Secondary | ICD-10-CM

## 2022-05-23 NOTE — Patient Instructions (Signed)
Call [facility] to schedule your CT at the below locations.   Please let me know if you have issues or problems scheduling the CT   [facility] [id number], [place] [address]  [place] [address] [place], [place] [address]

## 2022-05-24 ENCOUNTER — Ambulatory Visit
Admission: RE | Admit: 2022-05-24 | Discharge: 2022-05-24 | Disposition: A | Discharge status: Home / Self Care | Payer: 59 | Source: Ambulatory Visit | Attending: Podiatry | Admitting: Podiatry

## 2022-05-24 ENCOUNTER — Telehealth: Payer: Self-pay | Admitting: *Deleted

## 2022-05-24 ENCOUNTER — Other Ambulatory Visit: Payer: Self-pay | Admitting: Podiatry

## 2022-05-24 DIAGNOSIS — L89616 Pressure-induced deep tissue damage of right heel: Secondary | ICD-10-CM

## 2022-05-24 DIAGNOSIS — Q6671 Congenital pes cavus, right foot: Secondary | ICD-10-CM

## 2022-05-24 DIAGNOSIS — S92044A Nondisplaced other fracture of tuberosity of right calcaneus, initial encounter for closed fracture: Secondary | ICD-10-CM

## 2022-05-24 DIAGNOSIS — S92042A Displaced other fracture of tuberosity of left calcaneus, initial encounter for closed fracture: Secondary | ICD-10-CM

## 2022-05-24 DIAGNOSIS — M216X1 Other acquired deformities of right foot: Secondary | ICD-10-CM

## 2022-05-24 MED ORDER — AMOXICILLIN-POT CLAVULANATE 875-125 MG PO TABS: 1.0000 | ORAL_TABLET | Freq: Two times a day (BID) | ORAL | 0 refills | Status: DC

## 2022-05-24 MED ORDER — OXYCODONE-ACETAMINOPHEN 5-325 MG PO TABS: 1.0000 | ORAL_TABLET | ORAL | 0 refills | Status: AC | PRN

## 2022-05-24 NOTE — Telephone Encounter (Signed)
Patient is calling for the status of an antibiotic and pain medicine that he thought was discussed during visit to be sent to pharmacy on file,not seeing in epic, please advise.

## 2022-05-25 ENCOUNTER — Ambulatory Visit (INDEPENDENT_AMBULATORY_CARE_PROVIDER_SITE_OTHER): Payer: Medicaid Other | Admitting: Podiatry

## 2022-05-25 DIAGNOSIS — L89616 Pressure-induced deep tissue damage of right heel: Secondary | ICD-10-CM

## 2022-05-25 DIAGNOSIS — M216X1 Other acquired deformities of right foot: Secondary | ICD-10-CM

## 2022-05-25 DIAGNOSIS — S92042A Displaced other fracture of tuberosity of left calcaneus, initial encounter for closed fracture: Secondary | ICD-10-CM

## 2022-05-25 DIAGNOSIS — Q6671 Congenital pes cavus, right foot: Secondary | ICD-10-CM

## 2022-05-28 NOTE — Progress Notes (Signed)
  Subjective:  Patient ID: Justin Duran, male    DOB: [birth year],  MRN: [id number]  Chief Complaint  Patient presents with   Routine Post Op    POV #5 DOS 03/17/2022 RT FOOT CAVUS RECONSTRUCTION W/ACHILLES LENGTHENING, TENDON TRANSFER, BONE CUT IN HEEL AND 1ST METATARSAL , STEINDLER STRIPPING    22 y.o. male returns for post-op check.  Having a lot of pain, he fell on Sunday on to the foot in the boot, notes it is swollen and bruised  Review of Systems: Negative except as noted in the HPI. Denies N/V/F/Ch.   Objective:  There were no vitals filed for this visit. There is no height or weight on file to calculate BMI. Constitutional Well developed. Well nourished.  Vascular Foot warm and well perfused. Capillary refill normal to all digits.  Calf is soft and supple, no posterior calf or knee pain, negative Homans' sign  Neurologic Normal speech. Oriented to person, place, and time. Epicritic sensation to light touch grossly present bilaterally.  Dermatologic Incisions are well-healed no signs of infection not hypertrophic.  Posterior heel ulceration deep tissue injury measuring 2.5 x 1.5 x 0.3 cm.  Central eschar.  Healing well on perimeter.  Blood blister resolving.  No signs of infection. Bruisiing dorsal to this   Orthopedic: Significant pain and edema today, does have active ROM of Achilles       Multiple view plain film radiographs: New radiographs taken today show periprosthetic fracture around posterior tuber, some displacement but no loss of correction  Assessment:   1. Congenital pes cavus, right foot   2. Acquired cavovarus deformity of foot, right   3. Pressure injury of deep tissue of right heel   4. Other closed displaced fracture of tuberosity of left calcaneus, initial encounter    Plan:  Patient was evaluated and treated and all questions answered.  S/p foot surgery right -Unfortunately due to a fall he has a periprosthetic fracture of the calcaneus, there  is displacement but no large loss of correction. The first metatarsal osteotomy appears intact. Stat CT ordered. NWB in boot. Continue current wound care plan. Would like to re evaluate soft tissue in 2 days, currently no signs of necrosis or impending skin compromise dorsal to the wound. Lab work ordered  Return in 2 days (on 05/25/2022) for post op (no x-rays).

## 2022-05-29 NOTE — Progress Notes (Signed)
Subjective:  Patient ID: Justin Duran, male    DOB: 08/11/2000,  MRN: [id number]  Chief Complaint  Patient presents with   Routine Post Op      Follow up right heel ulcer         POV #6 DOS 03/17/2022 RT FOOT CAVUS RECONSTRUCTION W/ACHILLES LENGTHENING, TENDON TRANSFER, BONE CUT IN HEEL AND 1ST METATARSAL , STEINDLER STRIPPING    22 y.o. male returns for post-op check.  Pain is doing much better he completed the CT scan  Review of Systems: Negative except as noted in the HPI. Denies N/V/F/Ch.   Objective:  There were no vitals filed for this visit. There is no height or weight on file to calculate BMI. Constitutional Well developed. Well nourished.  Vascular Foot warm and well perfused. Capillary refill normal to all digits.  Calf is soft and supple, no posterior calf or knee pain, negative Homans' sign  Neurologic Normal speech. Oriented to person, place, and time. Epicritic sensation to light touch grossly present bilaterally.  Dermatologic Incisions are well-healed no signs of infection not hypertrophic.  Posterior heel ulceration deep tissue injury measuring 2.5 x 1.5 x 0.3 cm.  Central eschar.  Healing well on perimeter.  No signs of infection or worsening skin necrosis, bruising is resolving  Orthopedic: Pain is much better   Narrative & Impression  CLINICAL DATA:  Calcaneal fracture   EXAM: CT OF THE RIGHT FOOT WITHOUT CONTRAST   TECHNIQUE: Multidetector CT imaging of the right foot was performed according to the standard protocol. Multiplanar CT image reconstructions were also generated.   RADIATION DOSE REDUCTION: This exam was performed according to the departmental dose-optimization program which includes automated exposure control, adjustment of the mA and/or kV according to patient size and/or use of iterative reconstruction technique.   COMPARISON:  05/23/2022 radiographs and CT from 12/12/2021   FINDINGS: Bones/Joint/Cartilage   Faint residual  visibility of portions of the posterior calcaneal fracture, with fracture plane traversed by 2 screws and with the posterior fragment having partially fused in with 1.2 cm of proximal displacement of the posterior fragment. There is some bandlike periosteal reaction or heterotopic calcification along the posteroinferior calcaneus as shown on image 58 series 7, adjacent to part of the fracture plane.   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 There is cortication along the osteotomy margins.   Bony demineralization noted especially along the cortex in a diffuse manner.   Ligaments   Suboptimally assessed by CT.   Muscles and Tendons   Mild flexor hallucis longus tenosynovitis just proximal to the knot of Henry. Tendinopathy and expansion of the peroneus longus tendon. Attenuated attachment of the tibialis posterior to the navicular, correlate clinically in assessing for tibialis posterior dysfunction.   Distal Achilles tendinopathy. Indistinctness and thickening of the proximal plantar fascia which attaches in the vicinity of the fracture, biomechanical integrity uncertain.   Soft tissues   Subcutaneous edema along the heel, especially posterolaterally and along the posteroinferior heel.   IMPRESSION: 1. Partial union the posterior calcaneal fracture, with fracture plane traversed by 2 screws and with 1.2 cm of partially fused in cephalad displacement of the posterior fragment. 2. Nonunited first metatarsal osteotomy with a dorsal staple type fixator in place. 3. Tendinopathy and expansion of the peroneus longus tendon. 4. Attenuated attachment of the tibialis posterior to the navicular, correlate clinically in assessing for tibialis posterior dysfunction. 5. Distal Achilles tendinopathy. 6.  Indistinctness and thickening of the proximal plantar fascia which attaches in the vicinity of the fracture,  biomechanical integrity uncertain. 7. Subcutaneous edema along the heel, especially posterolaterally and along the posteroinferior heel.     Electronically Signed   By: Gaylyn Rong M.D.   On: 05/24/2022 10:53     Multiple view plain film radiographs: New radiographs taken today show periprosthetic fracture around posterior tuber, some displacement but no loss of correction  Assessment:   No diagnosis found.  Plan:  Patient was evaluated and treated and all questions answered.  S/p foot surgery right -We reviewed the results of his CT scan.  There is some fracture of the posterior tuber but there is minimal displacement.  We did discuss this will need to fully heal before he is able to begin full weightbearing and transition back to shoes.  I expect he likely will need noninvasive bone stimulation as well due to the delayed union of this as well as the osteotomy site.  I will see him back in 1 week for wound care and debridement of the wound.  No follow-ups on file.

## 2022-06-01 ENCOUNTER — Ambulatory Visit (INDEPENDENT_AMBULATORY_CARE_PROVIDER_SITE_OTHER): Payer: Medicaid Other | Admitting: Podiatry

## 2022-06-01 DIAGNOSIS — L89616 Pressure-induced deep tissue damage of right heel: Secondary | ICD-10-CM | POA: Diagnosis not present

## 2022-06-01 DIAGNOSIS — L89611 Pressure ulcer of right heel, stage 1: Secondary | ICD-10-CM

## 2022-06-04 NOTE — Progress Notes (Signed)
Subjective:  Patient ID: Justin Duran, male    DOB: 2000-11-06,  MRN: [id number]  Chief Complaint  Patient presents with   Post-op Problem    Wound care left heel  - POV #7 DOS 03/17/2022 RT FOOT CAVUS RECONSTRUCTION W/ACHILLES LENGTHENING, TENDON TRANSFER, BONE CUT IN HEEL AND 1ST METATARSAL , STEINDLER STRIPPING    22 y.o. male returns for post-op check.  He is feeling quite a bit better.  Not having much pain, he has been using the knee scooter and boot  Review of Systems: Negative except as noted in the HPI. Denies N/V/F/Ch.   Objective:  There were no vitals filed for this visit. There is no height or weight on file to calculate BMI. Constitutional Well developed. Well nourished.  Vascular Foot warm and well perfused. Capillary refill normal to all digits.  Calf is soft and supple, no posterior calf or knee pain, negative Homans' sign  Neurologic Normal speech. Oriented to person, place, and time. Epicritic sensation to light touch grossly present bilaterally.  Dermatologic   Posterior heel ulceration deep tissue injury measuring 2.5 x 1.4 x 0.3 cm.  Central eschar.  Healing well on perimeter.  Area of bruising at fracture site has resolved  Orthopedic: Pain is much better     Narrative & Impression  CLINICAL DATA:  Calcaneal fracture   EXAM: CT OF THE RIGHT FOOT WITHOUT CONTRAST   TECHNIQUE: Multidetector CT imaging of the right foot was performed according to the standard protocol. Multiplanar CT image reconstructions were also generated.   RADIATION DOSE REDUCTION: This exam was performed according to the departmental dose-optimization program which includes automated exposure control, adjustment of the mA and/or kV according to patient size and/or use of iterative reconstruction technique.   COMPARISON:  05/23/2022 radiographs and CT from 12/12/2021   FINDINGS: Bones/Joint/Cartilage   Faint residual visibility of portions of the posterior  calcaneal fracture, with fracture plane traversed by 2 screws and with the posterior fragment having partially fused in with 1.2 cm of proximal displacement of the posterior fragment. There is some bandlike periosteal reaction or heterotopic calcification along the posteroinferior calcaneus as shown on image 58 series 7, adjacent to part of the fracture plane.   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 There is cortication along the osteotomy margins.   Bony demineralization noted especially along the cortex in a diffuse manner.   Ligaments   Suboptimally assessed by CT.   Muscles and Tendons   Mild flexor hallucis longus tenosynovitis just proximal to the knot of Henry. Tendinopathy and expansion of the peroneus longus tendon. Attenuated attachment of the tibialis posterior to the navicular, correlate clinically in assessing for tibialis posterior dysfunction.   Distal Achilles tendinopathy. Indistinctness and thickening of the proximal plantar fascia which attaches in the vicinity of the fracture, biomechanical integrity uncertain.   Soft tissues   Subcutaneous edema along the heel, especially posterolaterally and along the posteroinferior heel.   IMPRESSION: 1. Partial union the posterior calcaneal fracture, with fracture plane traversed by 2 screws and with 1.2 cm of partially fused in cephalad displacement of the posterior fragment. 2. Nonunited first metatarsal osteotomy with a dorsal staple type fixator in place. 3. Tendinopathy and expansion of the peroneus longus tendon. 4. Attenuated attachment of the tibialis posterior to the navicular, correlate clinically in assessing for tibialis posterior dysfunction. 5. Distal Achilles tendinopathy. 6. Indistinctness and thickening of the proximal plantar fascia  which attaches in the vicinity of the fracture, biomechanical integrity uncertain. 7.  Subcutaneous edema along the heel, especially posterolaterally and along the posteroinferior heel.     Electronically Signed   By: Gaylyn Rong M.D.   On: 05/24/2022 10:53     Multiple view plain film radiographs: New radiographs taken today show periprosthetic fracture around posterior tuber, some displacement but no loss of correction  Assessment:   1. Pressure injury of right heel, stage 1     Plan:  Patient was evaluated and treated and all questions answered.  S/p foot surgery right -He is doing much better.  Today we focused primarily on his soft tissue wound healing.  After local anesthetic with lidocaine and prilocaine ointment achieved appropriate anesthesia a sharp scalpel was used for a full-thickness excisional debridement of the subcutaneous layer to remove the overlying eschar.  Postdebridement measurements and photographs are noted above.  There is no deep dehiscence exposure of incision hardware bone or screw sites.  No signs of infection.  Continue mupirocin ointment, hopefully at next visit can begin utilizing Prisma dressings.  New radiographs at next visit to assess fracture healing  Return in about 3 weeks (around 06/22/2022) for wound care, post op (new x-rays).

## 2022-06-27 ENCOUNTER — Ambulatory Visit (INDEPENDENT_AMBULATORY_CARE_PROVIDER_SITE_OTHER): Payer: Medicaid Other

## 2022-06-27 ENCOUNTER — Ambulatory Visit (INDEPENDENT_AMBULATORY_CARE_PROVIDER_SITE_OTHER): Payer: Medicaid Other | Admitting: Podiatry

## 2022-06-27 DIAGNOSIS — S92042A Displaced other fracture of tuberosity of left calcaneus, initial encounter for closed fracture: Secondary | ICD-10-CM | POA: Diagnosis not present

## 2022-06-27 DIAGNOSIS — L97412 Non-pressure chronic ulcer of right heel and midfoot with fat layer exposed: Secondary | ICD-10-CM | POA: Diagnosis not present

## 2022-07-02 ENCOUNTER — Other Ambulatory Visit: Payer: Self-pay

## 2022-07-02 ENCOUNTER — Emergency Department
Admission: EM | Admit: 2022-07-02 | Discharge: 2022-07-02 | Disposition: A | Discharge status: Home / Self Care | Payer: 59 | Attending: Emergency Medicine | Admitting: Emergency Medicine

## 2022-07-02 ENCOUNTER — Emergency Department: Payer: 59

## 2022-07-02 DIAGNOSIS — N200 Calculus of kidney: Secondary | ICD-10-CM

## 2022-07-02 DIAGNOSIS — R109 Unspecified abdominal pain: Secondary | ICD-10-CM | POA: Diagnosis present

## 2022-07-02 DIAGNOSIS — N132 Hydronephrosis with renal and ureteral calculous obstruction: Secondary | ICD-10-CM | POA: Diagnosis not present

## 2022-07-02 LAB — COMPREHENSIVE METABOLIC PANEL
ALT: 31 U/L (ref 0–44)
AST: 38 U/L (ref 15–41)
Albumin: 4.6 g/dL (ref 3.5–5.0)
Alkaline Phosphatase: 110 U/L (ref 38–126)
Anion gap: 11 (ref 5–15)
BUN: 13 mg/dL (ref 6–20)
CO2: 21 mmol/L — ABNORMAL LOW (ref 22–32)
Calcium: 9.2 mg/dL (ref 8.9–10.3)
Chloride: 106 mmol/L (ref 98–111)
Creatinine, Ser: 1.16 mg/dL (ref 0.61–1.24)
GFR, Estimated: >60 mL/min (ref >60)
Glucose, Bld: 156 mg/dL — ABNORMAL HIGH (ref 70–99)
Potassium: 4.1 mmol/L (ref 3.5–5.1)
Sodium: 138 mmol/L (ref 135–145)
Total Bilirubin: 0.9 mg/dL (ref 0.3–1.2)
Total Protein: 7.7 g/dL (ref 6.5–8.1)

## 2022-07-02 LAB — URINALYSIS, MICROSCOPIC (REFLEX)
Bacteria, UA: NONE SEEN
RBC / HPF: >50 RBC/hpf (ref 0–5)
Squamous Epithelial / HPF: NONE SEEN /HPF (ref 0–5)
WBC, UA: >50 WBC/hpf (ref 0–5)

## 2022-07-02 LAB — URINALYSIS, ROUTINE W REFLEX MICROSCOPIC
Glucose, UA: 100 mg/dL — AB
Leukocytes,Ua: NEGATIVE
Nitrite: POSITIVE — AB
Protein, ur: >300 mg/dL — AB
Specific Gravity, Urine: >1.03 — ABNORMAL HIGH (ref 1.005–1.030)
pH: 5 (ref 5.0–8.0)

## 2022-07-02 LAB — CBC
HCT: 44.9 % (ref 39.0–52.0)
Hemoglobin: 15.2 g/dL (ref 13.0–17.0)
MCH: 27.4 pg (ref 26.0–34.0)
MCHC: 33.9 g/dL (ref 30.0–36.0)
MCV: 81 fL (ref 80.0–100.0)
Platelets: 210 10*3/uL (ref 150–400)
RBC: 5.54 MIL/uL (ref 4.22–5.81)
RDW: 13.2 % (ref 11.5–15.5)
WBC: 10.2 10*3/uL (ref 4.0–10.5)
nRBC: 0 % (ref 0.0–0.2)

## 2022-07-02 LAB — LIPASE, BLOOD: Lipase: 23 U/L (ref 11–51)

## 2022-07-02 MED ORDER — CEPHALEXIN 500 MG PO CAPS: 500.0000 mg | ORAL_CAPSULE | Freq: Two times a day (BID) | ORAL | 0 refills | Status: DC

## 2022-07-02 MED ORDER — MORPHINE SULFATE (PF) 4 MG/ML IV SOLN
4.0000 mg | Freq: Once | INTRAVENOUS | Status: AC
  Administered 2022-07-02: 4 mg via INTRAVENOUS
  Filled 2022-07-02: qty 1

## 2022-07-02 MED ORDER — TAMSULOSIN HCL 0.4 MG PO CAPS: 0.4000 mg | ORAL_CAPSULE | Freq: Every day | ORAL | 0 refills | Status: DC

## 2022-07-02 MED ORDER — ONDANSETRON HCL 4 MG/2ML IJ SOLN
4.0000 mg | Freq: Once | INTRAMUSCULAR | Status: AC
  Administered 2022-07-02: 4 mg via INTRAVENOUS
  Filled 2022-07-02: qty 2

## 2022-07-02 MED ORDER — KETOROLAC TROMETHAMINE 30 MG/ML IJ SOLN
30.0000 mg | Freq: Once | INTRAMUSCULAR | Status: AC
  Administered 2022-07-02: 30 mg via INTRAVENOUS
  Filled 2022-07-02: qty 1

## 2022-07-02 MED ORDER — SODIUM CHLORIDE 0.9 % IV BOLUS
1000.0000 mL | Freq: Once | INTRAVENOUS | Status: AC
  Administered 2022-07-02: 1000 mL via INTRAVENOUS

## 2022-07-02 MED ORDER — OXYCODONE-ACETAMINOPHEN 5-325 MG PO TABS: 1.0000 | ORAL_TABLET | ORAL | 0 refills | Status: DC | PRN

## 2022-07-02 MED ORDER — SODIUM CHLORIDE 0.9 % IV SOLN
1.0000 g | Freq: Once | INTRAVENOUS | Status: AC
  Administered 2022-07-02: 1 g via INTRAVENOUS
  Filled 2022-07-02: qty 10

## 2022-07-02 NOTE — Discharge Instructions (Addendum)
Please take antibiotics for their entire course.  Please take your pain medication as needed but only as written.  Do not drink alcohol or drive while taking the pain medication.  Please follow-up with urology by calling the number provided to arrange a follow-up appointment.  Return to the emergency department for any painful urination, fever, or any other symptom personally concerning to yourself.

## 2022-07-02 NOTE — ED Triage Notes (Addendum)
Pt to ED via POV c/o abd pain. Pt states it starts in right side flank area and wraps around to right side of abdomen. Pt endorses some nausea and some burning with urination. Pt reports having having trouble peeing, says it feels like it doesn't want to come out. Denies CP, fevers, diarrhea.

## 2022-07-02 NOTE — ED Provider Notes (Signed)
[facility] Provider Note    Event Date/Time   First MD Initiated Contact with Patient 07/02/22 0732     (approximate)  History   Chief Complaint: Abdominal Pain  HPI  [place] J A Weidinger is a 22 y.o. male with no significant past medical history who presents to the emergency department for right flank pain.  According to the patient around 1 AM this morning he developed acute onset of sharp pain to the right flank radiating around to the right back.  Also states he is only been able to urinate small amounts since the pain started but has not noted any blood or painful urination.  Denies any fever.  States nausea but denies any vomiting.  No diarrhea.  Physical Exam   Triage Vital Signs: ED Triage Vitals  Enc Vitals Group     BP 07/02/22 0600 (!) 140/84     Pulse Rate 07/02/22 0600 62     Resp 07/02/22 0600 18     Temp 07/02/22 0600 (!) 97.5 F (36.4 C)     Temp src --      SpO2 07/02/22 0600 95 %     Weight 07/02/22 0608 235 lb (106.6 kg)     Height 07/02/22 0608 5\' 11"  (1.803 m)     Head Circumference --      Peak Flow --      Pain Score 07/02/22 0608 10     Pain Loc --      Pain Edu? --      Excl. in GC? --     Most recent vital signs: Vitals:   07/02/22 0600  BP: (!) 140/84  Pulse: 62  Resp: 18  Temp: (!) 97.5 F (36.4 C)  SpO2: 95%    General: Awake, no distress.  CV:  Good peripheral perfusion.  Regular rate and rhythm  Resp:  Normal effort.  Equal breath sounds bilaterally.  Abd:  No distention.  Soft, mild to moderate tenderness of the right abdomen.  No rebound or guarding.  ED Results / Procedures / Treatments   RADIOLOGY  I have reviewed and interpreted the CT images.  Patient does appear to have a small stone in the right ureter. Radiology confirms 1 to 2 mm distal right ureteral stone.  MEDICATIONS ORDERED IN ED: Medications  sodium chloride 0.9 % bolus 1,000 mL (has no administration in time range)  ondansetron  (ZOFRAN) injection 4 mg (has no administration in time range)  morphine (PF) 4 MG/ML injection 4 mg (has no administration in time range)     IMPRESSION / MDM / ASSESSMENT AND PLAN / ED COURSE  I reviewed the triage vital signs and the nursing notes.  Patient's presentation is most consistent with acute illness / injury with system symptoms.  Patient presents emergency department for right flank pain.  Onset of symptoms around 1 AM along with nausea.  Mild tenderness to palpation of the right abdomen.  Differential would include ureterolithiasis, UTI, pyelonephritis, appendicitis.  We will check labs, urinalysis to obtain a CT renal scan treat pain nausea and IV hydrate while awaiting results.  Patient agreeable to plan.  Patient CT scan confirms right-sided ureteral stone 1 to 2 mm with mild hydronephrosis.  Patient CBC is normal including a normal white blood cell count, chemistry is normal.  Patient's urinalysis is nitrite positive with greater than 50 red and white cells although no bacteria seen.  Given the kidney stone we will cover with IV Rocephin  and discharged with Keflex.  I discussed strict return precautions for any fever or dysuria, otherwise patient will follow-up with urology.  Will place patient on Percocet and Flomax in addition to Keflex.  Patient agreeable to plan of care.  FINAL CLINICAL IMPRESSION(S) / ED DIAGNOSES   Right flank pain Kidney stone    Note:  This document was prepared using Dragon voice recognition software and may include unintentional dictation errors.   Minna Antis, MD 07/02/22 1008

## 2022-07-03 ENCOUNTER — Telehealth: Payer: Self-pay

## 2022-07-03 LAB — URINE CULTURE: Culture: NO GROWTH

## 2022-07-03 NOTE — Progress Notes (Signed)
Subjective:  Patient ID: Justin Duran, male    DOB: 2000/05/20,  MRN: [id number]  Chief Complaint  Patient presents with   Fracture    Follow up fracture after surgery -  DOS 03/17/2022 RT FOOT CAVUS RECONSTRUCTION W/ACHILLES LENGTHENING, TENDON TRANSFER, BONE CUT IN HEEL AND 1ST METATARSAL , STEINDLER STRIPPING    22 y.o. male returns for post-op check.   Review of Systems: Negative except as noted in the HPI. Denies N/V/F/Ch.   Objective:  There were no vitals filed for this visit. There is no height or weight on file to calculate BMI. Constitutional Well developed. Well nourished.  Vascular Foot warm and well perfused. Capillary refill normal to all digits.  Calf is soft and supple, no posterior calf or knee pain, negative Homans' sign  Neurologic Normal speech. Oriented to person, place, and time. Epicritic sensation to light touch grossly present bilaterally.  Dermatologic   Posterior heel ulceration deep tissue injury measuring 2.0 x 1.0 x 0.3 cm.  Central fibrotic tissue, no eschar.  Healing well on perimeter.  Area of bruising at fracture site has resolved  Orthopedic: Pain is much better     Narrative & Impression  CLINICAL DATA:  Calcaneal fracture   EXAM: CT OF THE RIGHT FOOT WITHOUT CONTRAST   TECHNIQUE: Multidetector CT imaging of the right foot was performed according to the standard protocol. Multiplanar CT image reconstructions were also generated.   RADIATION DOSE REDUCTION: This exam was performed according to the departmental dose-optimization program which includes automated exposure control, adjustment of the mA and/or kV according to patient size and/or use of iterative reconstruction technique.   COMPARISON:  05/23/2022 radiographs and CT from 12/12/2021   FINDINGS: Bones/Joint/Cartilage   Faint residual visibility of portions of the posterior calcaneal fracture, with fracture plane traversed by 2 screws and with the posterior fragment  having partially fused in with 1.2 cm of proximal displacement of the posterior fragment. There is some bandlike periosteal reaction or heterotopic calcification along the posteroinferior calcaneus as shown on image 58 series 7, adjacent to part of the fracture plane.   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 There is cortication along the osteotomy margins.   Bony demineralization noted especially along the cortex in a diffuse manner.   Ligaments   Suboptimally assessed by CT.   Muscles and Tendons   Mild flexor hallucis longus tenosynovitis just proximal to the knot of Henry. Tendinopathy and expansion of the peroneus longus tendon. Attenuated attachment of the tibialis posterior to the navicular, correlate clinically in assessing for tibialis posterior dysfunction.   Distal Achilles tendinopathy. Indistinctness and thickening of the proximal plantar fascia which attaches in the vicinity of the fracture, biomechanical integrity uncertain.   Soft tissues   Subcutaneous edema along the heel, especially posterolaterally and along the posteroinferior heel.   IMPRESSION: 1. Partial union the posterior calcaneal fracture, with fracture plane traversed by 2 screws and with 1.2 cm of partially fused in cephalad displacement of the posterior fragment. 2. Nonunited first metatarsal osteotomy with a dorsal staple type fixator in place. 3. Tendinopathy and expansion of the peroneus longus tendon. 4. Attenuated attachment of the tibialis posterior to the navicular, correlate clinically in assessing for tibialis posterior dysfunction. 5. Distal Achilles tendinopathy. 6. Indistinctness and thickening of the proximal plantar fascia which attaches in the vicinity of the fracture, biomechanical integrity uncertain. 7. Subcutaneous edema along the heel, especially posterolaterally and  along the posteroinferior heel.      Electronically Signed   By: Gaylyn Rong M.D.   On: 05/24/2022 10:53     Multiple view plain film radiographs: New radiographs taken today show good early consolidation across fracture site Assessment:   1. Other closed displaced fracture of tuberosity of left calcaneus, initial encounter   2. Ulcer of right heel and midfoot with fat layer exposed (HCC)     Plan:  Patient was evaluated and treated and all questions answered.  S/p foot surgery right -He is doing much better in regards to his fracture healing and showing early consolidation in his x-rays across the fracture site.  Soft tissue ulceration, after local anesthetic with lidocaine and prilocaine ointment achieved appropriate anesthesia a sharp scalpel was used for a full-thickness excisional debridement of the subcutaneous layer to remove the overlying eschar.  Postdebridement measurements and photographs are noted above.  There is no deep dehiscence exposure of incision hardware bone or screw sites.  No signs of infection.  Today we began utilizing Prisma dressings change every 2 to 3 days.  Return in 4 weeks for ongoing wound care.  Return in about 4 weeks (around 07/25/2022) for post op (new x-rays), wound care.

## 2022-07-03 NOTE — Transitions of Care (Post Inpatient/ED Visit) (Signed)
Unable to reach pt by phone and left v/m requesting cb at [phone]     07/03/2022  Name: Justin Duran MRN: [id number] DOB: February 01, 2001  Today's TOC FU Call Status: Today's TOC FU Call Status:: Unsuccessul Call (1st Attempt) Unsuccessful Call (1st Attempt) Date: 07/03/22  Attempted to reach the patient regarding the most recent Inpatient/ED visit.  Follow Up Plan: Additional outreach attempts will be made to reach the patient to complete the Transitions of Care (Post Inpatient/ED visit) call.   Signature Lewanda Rife, LPN

## 2022-07-06 NOTE — Telephone Encounter (Signed)
Noted  

## 2022-07-06 NOTE — Transitions of Care (Post Inpatient/ED Visit) (Signed)
I spoke with pt and pt said he has been taking abx, tamsulosin and pain med as needed. Recently pt has not had any pain and thinks passed stone; pt has not been straining urine.  pt thinks he has passed the stone and if needed pt will call and schedule appt with Dr Jerilee Field urologist and pt has info on how to contact urology office. Sending note to Mordecai Maes NP.    07/06/2022  Name: Justin Duran MRN: [id number] DOB: 2000/08/31  Today's TOC FU Call Status: Today's TOC FU Call Status:: Successful TOC FU Call Competed Unsuccessful Call (1st Attempt) Date: 07/03/22 [facility] FU Call Complete Date: 07/06/22  Transition Care Management Follow-up Telephone Call Date of Discharge: 07/02/22 Discharge Facility: [facility]) Type of Discharge: Emergency Department Reason for ED Visit: Renal (kidney stone) Renal Diagnosis:  (kidney stone; pt thinks he has passed the stone and if needed pt will call and schedule appt with Dr Jerilee Field urologist and pt has info on how to contact urology office.) How have you been since you were released from the hospital?: Better Any questions or concerns?: No  Items Reviewed: Did you receive and understand the discharge instructions provided?: Yes Medications obtained,verified, and reconciled?: Yes (Medications Reviewed) (Cephalexin 500 mg, oxycodone apap 5-325 mg and Tamsulosin 4 mg. pt understood how to take all meds.) Any new allergies since your discharge?: No Dietary orders reviewed?: NA Do you have support at home?: Yes People in Home: other relative(s) (cousin) Name of Support/Comfort Primary Source: Katie  Medications Reviewed Today: Medications Reviewed Today     Reviewed by Edwin Cap, DPM (Physician) on 04/19/22 at 1302  Med List Status: <None>   Medication Order Taking? Sig Documenting Provider Last Dose Status Informant  doxycycline (VIBRA-TABS) 100 MG tablet [id number]  Take 1 tablet (100 mg total)  by mouth 2 (two) times daily. Candelaria Stagers, DPM  Active   enoxaparin (LOVENOX) 40 MG/0.4ML injection [id number]  Inject 0.4 mLs (40 mg total) into the skin daily. Edwin Cap, DPM  Expired 04/16/22 2359   gabapentin (NEURONTIN) 300 MG capsule [id number]  Take 1 capsule (300 mg total) by mouth 3 (three) times daily for 7 days. Edwin Cap, DPM  Expired 03/24/22 2359   mupirocin ointment (BACTROBAN) 2 % [id number]  Apply 1 Application topically 2 (two) times daily. Edwin Cap, DPM  Active             Home Care and Equipment/Supplies: Were Home Health Services Ordered?: NA Any new equipment or medical supplies ordered?: NA  Functional Questionnaire: Do you need assistance with bathing/showering or dressing?: No Do you need assistance with meal preparation?: No Do you need assistance with eating?: No Do you have difficulty maintaining continence: No Do you need assistance with getting out of bed/getting out of a chair/moving?: No Do you have difficulty managing or taking your medications?: No  Follow up appointments reviewed: PCP Follow-up appointment confirmed?: NA Specialist Hospital Follow-up appointment confirmed?: No Reason Specialist Follow-Up Not Confirmed: Patient has Specialist Provider Number and will Call for Appointment Do you need transportation to your follow-up appointment?: No Do you understand care options if your condition(s) worsen?: Yes-patient verbalized understanding    SIGNATURE Lewanda Rife, LPN

## 2022-07-22 DIAGNOSIS — Z419 Encounter for procedure for purposes other than remedying health state, unspecified: Secondary | ICD-10-CM | POA: Diagnosis not present

## 2022-07-25 ENCOUNTER — Ambulatory Visit (INDEPENDENT_AMBULATORY_CARE_PROVIDER_SITE_OTHER): Payer: Medicaid Other | Admitting: Podiatry

## 2022-07-25 ENCOUNTER — Encounter: Payer: Self-pay | Admitting: Podiatry

## 2022-07-25 ENCOUNTER — Ambulatory Visit (INDEPENDENT_AMBULATORY_CARE_PROVIDER_SITE_OTHER): Payer: Medicaid Other

## 2022-07-25 DIAGNOSIS — S92042A Displaced other fracture of tuberosity of left calcaneus, initial encounter for closed fracture: Secondary | ICD-10-CM

## 2022-07-25 DIAGNOSIS — M6788 Other specified disorders of synovium and tendon, other site: Secondary | ICD-10-CM

## 2022-07-25 DIAGNOSIS — M24571 Contracture, right ankle: Secondary | ICD-10-CM

## 2022-07-25 DIAGNOSIS — M216X1 Other acquired deformities of right foot: Secondary | ICD-10-CM

## 2022-07-28 NOTE — Progress Notes (Signed)
Subjective:  Patient ID: Justin Duran, male    DOB: 2000-02-25,  MRN: [id number]  Chief Complaint  Patient presents with   Fracture    Follow up fracture after surgery -  DOS 03/17/2022 RT FOOT CAVUS RECONSTRUCTION W/ACHILLES LENGTHENING, TENDON TRANSFER, BONE CUT IN HEEL AND 1ST METATARSAL , STEINDLER STRIPPING    22 y.o. male returns for post-op check.  He is doing much better not having much pain  Review of Systems: Negative except as noted in the HPI. Denies N/V/F/Ch.   Objective:  There were no vitals filed for this visit. There is no height or weight on file to calculate BMI. Constitutional Well developed. Well nourished.  Vascular Foot warm and well perfused. Capillary refill normal to all digits.  Calf is soft and supple, no posterior calf or knee pain, negative Homans' sign  Neurologic Normal speech. Oriented to person, place, and time. Epicritic sensation to light touch grossly present bilaterally.  Dermatologic   Posterior heel ulceration is now limited to breakdown of skin, much improved  Orthopedic: Pain is much better     Narrative & Impression  CLINICAL DATA:  Calcaneal fracture   EXAM: CT OF THE RIGHT FOOT WITHOUT CONTRAST   TECHNIQUE: Multidetector CT imaging of the right foot was performed according to the standard protocol. Multiplanar CT image reconstructions were also generated.   RADIATION DOSE REDUCTION: This exam was performed according to the departmental dose-optimization program which includes automated exposure control, adjustment of the mA and/or kV according to patient size and/or use of iterative reconstruction technique.   COMPARISON:  05/23/2022 radiographs and CT from 12/12/2021   FINDINGS: Bones/Joint/Cartilage   Faint residual visibility of portions of the posterior calcaneal fracture, with fracture plane traversed by 2 screws and with the posterior fragment having partially fused in with 1.2 cm of proximal displacement of  the posterior fragment. There is some bandlike periosteal reaction or heterotopic calcification along the posteroinferior calcaneus as shown on image 58 series 7, adjacent to part of the fracture plane.   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 There is cortication along the osteotomy margins.   Bony demineralization noted especially along the cortex in a diffuse manner.   Ligaments   Suboptimally assessed by CT.   Muscles and Tendons   Mild flexor hallucis longus tenosynovitis just proximal to the knot of Henry. Tendinopathy and expansion of the peroneus longus tendon. Attenuated attachment of the tibialis posterior to the navicular, correlate clinically in assessing for tibialis posterior dysfunction.   Distal Achilles tendinopathy. Indistinctness and thickening of the proximal plantar fascia which attaches in the vicinity of the fracture, biomechanical integrity uncertain.   Soft tissues   Subcutaneous edema along the heel, especially posterolaterally and along the posteroinferior heel.   IMPRESSION: 1. Partial union the posterior calcaneal fracture, with fracture plane traversed by 2 screws and with 1.2 cm of partially fused in cephalad displacement of the posterior fragment. 2. Nonunited first metatarsal osteotomy with a dorsal staple type fixator in place. 3. Tendinopathy and expansion of the peroneus longus tendon. 4. Attenuated attachment of the tibialis posterior to the navicular, correlate clinically in assessing for tibialis posterior dysfunction. 5. Distal Achilles tendinopathy. 6. Indistinctness and thickening of the proximal plantar fascia which attaches in the vicinity of the fracture, biomechanical integrity uncertain. 7. Subcutaneous edema along the heel, especially posterolaterally and along the posteroinferior heel.     Electronically Signed  By: Gaylyn Rong M.D.   On:  05/24/2022 10:53     Multiple view plain film radiographs: New films taken today show consolidation across fracture site  Assessment:   1. Other closed displaced fracture of tuberosity of left calcaneus, initial encounter   2. Ulcer of right heel and midfoot with fat layer exposed (HCC)     Plan:  Patient was evaluated and treated and all questions answered.  S/p foot surgery right -Overall doing much better skin is nearly fully healed.  He may begin full weightbearing in the boot.  Hopefully after next visit can begin gradual transition back to shoe gear.  Begin physical therapy as well.  Referral sent.  Return in about 4 weeks (around 07/25/2022) for post op (new x-rays), wound care.

## 2022-08-08 NOTE — Therapy (Unsigned)
OUTPATIENT PHYSICAL THERAPY LOWER EXTREMITY EVALUATION   Patient Name: Justin Duran MRN: [id number] DOB:2000/10/08, 22 y.o., male Today's Date: 08/09/2022  END OF SESSION:  PT End of Session - 08/09/22 1743     Visit Number 1    Number of Visits 12    Date for PT Re-Evaluation 10/04/22    Authorization Type [employer]    PT Start Time 1735    PT Stop Time 1815    PT Time Calculation (min) 40 min    Activity Tolerance Patient tolerated treatment well    Behavior During Therapy [facility] for tasks assessed/performed             No past medical history on file. Past Surgical History:  Procedure Laterality Date   CALCANEAL OSTEOTOMY Right 03/17/2022   Procedure: CALCANEAL OSTEOTOMY;  Surgeon: Edwin Cap, DPM;  Location: [facility] ORS;  Service: Podiatry;  Laterality: Right;  Lateral calcaneal displacement osteotomy   CAPSULOTOMY Right 03/17/2022   Procedure: MIDFOOT W/TENDON LENGTHENING & TENDON TRANSFER;  Surgeon: Edwin Cap, DPM;  Location: [facility];  Service: Podiatry;  Laterality: Right;  PT tendon lengthening; peroneus longus to brevis transfer   CARDIAC SURGERY     CIRCUMCISION     HERNIA REPAIR  12/22/1998   Performed at 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 03/18/2022   Preventative health care 02/03/2022   History of heart surgery 02/03/2022   Pre-op exam 02/03/2022   Obesity  (BMI 30-39.9) 02/03/2022   Marijuana use 02/03/2022   Electronic cigarette use 02/03/2022   Pes cavus, 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   Pain in joint, lower leg 12/22/2013   Hallux hammertoe 12/22/2013    PCP: Eden Emms, NP   REFERRING PROVIDER: Edwin Cap, DPM  REFERRING DIAG: [phone] (ICD-10-CM) - Other closed displaced fracture of tuberosity of left calcaneus, initial encounter M21.6X1 (ICD-10-CM) - Acquired cavovarus deformity of foot, right M67.88 (ICD-10-CM) - Peroneal tendinosis, right M24.571 (ICD-10-CM) - Equinus contracture of right ankle  THERAPY DIAG:  Other abnormalities of gait and mobility - Plan: PT plan of care cert/re-cert  Muscle weakness (generalized) - Plan: PT plan of care cert/re-cert  Closed nondisplaced fracture of right calcaneus with routine healing, unspecified portion of calcaneus, subsequent encounter - Plan: PT plan of care cert/re-cert  Rationale for Evaluation and Treatment: Rehabilitation  ONSET DATE: 03/17/22 surgery date  SUBJECTIVE:   SUBJECTIVE STATEMENT: Underwent reconstructive R foot surgery for tissue lengthening and plantar fascia release 02/2022, doing well with minimal pain.  Stepped wrong in April 2024 and fractured R calcaneus.  Confined to CAM boot WBAT until next MD f/u 08/23/22.  Overall pain levels minimal  PERTINENT HISTORY: Evaluate and treat for 1-2 sessions / week for 4-6 weeks or at therapist's discretion. Patient underwent cavus reconstructive surgery in January with multiple tendon lengthenings and osteotomies, was complicated by postoperative wound and fracture, healed now  and should begin physical therapy would like to include ROM, strengthening, stability, and manual therapy. Modalities PRN at therapist's discretion. PAIN:  Are you having pain? Yes: NPRS scale: 3/10 Pain location: R ankle/foot Pain description: ache Aggravating factors: weight bearing Relieving factors: rest  PRECAUTIONS: Other: WBAT in boot  WEIGHT  BEARING RESTRICTIONS: Other: WBAT in boot  FALLS:  Has patient fallen in last 6 months? No  OCCUPATION: not working  PLOF: Independent  PATIENT GOALS: To reduce and manage my foot issues  NEXT MD VISIT: 4 weeks  OBJECTIVE:   DIAGNOSTIC FINDINGS: IMPRESSION: 1. Partial union the posterior calcaneal fracture, with fracture plane traversed by 2 screws and with 1.2 cm of partially fused in cephalad displacement of the posterior fragment. 2. Nonunited first metatarsal osteotomy with a dorsal staple type fixator in place. 3. Tendinopathy and expansion of the peroneus longus tendon. 4. Attenuated attachment of the tibialis posterior to the navicular, correlate clinically in assessing for tibialis posterior dysfunction. 5. Distal Achilles tendinopathy. 6. Indistinctness and thickening of the proximal plantar fascia which attaches in the vicinity of the fracture, biomechanical integrity uncertain. 7. Subcutaneous edema along the heel, especially posterolaterally and along the posteroinferior heel.     Electronically Signed   By: Gaylyn Rong M.D.   On: 05/24/2022 10:53  PATIENT SURVEYS:  FOTO 58(79 predicted)  MUSCLE LENGTH: Hamstrings: Right 60 deg; Left 70 deg  POSTURE:  deferred  PALPATION: deferred  LOWER EXTREMITY ROM:  A/PROM Right eval Left eval  Hip flexion    Hip extension    Hip abduction    Hip adduction    Hip internal rotation    Hip external rotation    Knee flexion    Knee extension    Ankle dorsiflexion 4/6d   Ankle plantarflexion 30d   Ankle inversion    Ankle eversion     (Blank rows = not tested)  LOWER EXTREMITY MMT:  MMT Right eval Left eval  Hip flexion    Hip extension    Hip abduction    Hip adduction    Hip internal rotation    Hip external rotation    Knee flexion    Knee extension    Ankle dorsiflexion 3   Ankle plantarflexion 3   Ankle inversion    Ankle eversion     (Blank rows = not tested)  LOWER  EXTREMITY SPECIAL TESTS:  deferred  FUNCTIONAL TESTS:  5 times sit to stand: 15s arms crossed  GAIT: Distance walked: 28ftx2 Assistive device utilized:  CAM boot Level of assistance: Complete Independence Comments: antalgic gait with decreased R stance time   TODAY'S TREATMENT:                                                                                                                              DATE: 08/09/22 Eval and HEP   PATIENT EDUCATION:  Education details: Discussed eval findings, rehab rationale and POC and patient is  in agreement  Person educated: Patient Education method: Explanation Education comprehension: verbalized understanding and needs further education  HOME EXERCISE PROGRAM: Access Code: [email or web address] URL: [email or web address] Date: 08/09/2022 Prepared by: Gustavus Bryant  Exercises - Long Sitting Plantar Fascia Stretch with Towel  - 3 x daily - 5 x weekly - 1 sets - 3 reps - 30s hold - Towel Scrunches  - 3 x daily - 5 x weekly - 1 sets - 1 reps - 60s hold - Toe Yoga - Alternating Great Toe and Lesser Toe Extension  - 3 x daily - 5 x weekly - 1 sets - 30 reps  ASSESSMENT:  CLINICAL IMPRESSION: Patient is a 22 y.o. male who was seen today for physical therapy evaluation and treatment for R foot reconstruction including plantar fascia release complicated by R calcaneal fx.  He is WBAT in boot with minimal pain. Patient presents with decreased ROM in R DF/PF with strength deficits in all 4 planes.  Assessment focus on PF/DF due to congenital deformities.  He is compliant with WB status and boot wear, pain and swelling are minimal.  Patient is a good candidate for OPPT to regain ROM strength and function in R ankle/foot.   OBJECTIVE IMPAIRMENTS: Abnormal gait, decreased balance, decreased endurance, decreased knowledge of condition, decreased mobility, difficulty walking, decreased ROM, decreased strength, impaired flexibility, improper body  mechanics, postural dysfunction, and pain.   ACTIVITY LIMITATIONS: carrying, lifting, standing, squatting, and stairs  PERSONAL FACTORS: Age, Fitness, Past/current experiences, and Time since onset of injury/illness/exacerbation are also affecting patient's functional outcome.   REHAB POTENTIAL: Good  CLINICAL DECISION MAKING: Evolving/moderate complexity  EVALUATION COMPLEXITY: Low   GOALS: Goals reviewed with patient? No  SHORT TERM GOALS: Target date: 08/30/2022   Patient to demonstrate independence in HEP  Baseline: [email or web address] Goal status: INITIAL  2.  Patient to negotiate 16 stairs in boot safely with most appropriate pattern. Baseline: TBD Goal status: INITIAL    LONG TERM GOALS: Target date: 09/20/2022    Increase R ankle strength to 4/5 Baseline:   R 08/09/22   Ankle dorsiflexion 3   Ankle plantarflexion 3    Goal status: INITIAL  2.  Increase A/PROM to 8/10d DF Baseline:   R 08/09/22   Ankle dorsiflexion 4/6d   Ankle plantarflexion 30d    Goal status: INITIAL  3.  22ft ambulation w/o CAM boot Baseline: 37ft with CAM boot Goal status: INITIAL  4.  Patient to negotiate 16 steps with most appropriate pattern Baseline: TBD Goal status: INITIAL  5.  Increase FOTO score to 79 Baseline: 54 Goal status: INITIAL     PLAN:  PT FREQUENCY: 1-2x/week  PT DURATION: 8 weeks  PLANNED INTERVENTIONS: Therapeutic exercises, Therapeutic activity, Neuromuscular re-education, Balance training, Gait training, Patient/Family education, Self Care, Joint mobilization, Stair training, DME instructions, Aquatic Therapy, Dry Needling, Electrical stimulation, Cryotherapy, Moist heat, Manual therapy, and Re-evaluation  PLAN FOR NEXT SESSION: HEP review and update, manual techniques as appropriate, aerobic tasks, ROM and flexibility activities, strengthening and PREs, TPDN, gait and balance training as needed     Hildred Laser, PT 08/09/2022, 6:26 PM   Check all  possible CPT codes: 16109 - PT Re-evaluation, 97110- Therapeutic Exercise, [phone]- Neuro Re-education, [phone] - Gait Training, [phone] - Manual Therapy, 97530 - Therapeutic Activities, and 97535 - Self Care    Check all conditions that are expected to impact treatment: {Conditions expected to impact treatment:Musculoskeletal disorders, Contractures, spasticity or fracture relevant to requested treatment, and Structural or anatomic  abnormalities   If treatment provided at initial evaluation, no treatment charged due to lack of authorization.

## 2022-08-09 ENCOUNTER — Other Ambulatory Visit: Payer: Self-pay

## 2022-08-09 ENCOUNTER — Ambulatory Visit: Payer: 59 | Attending: Podiatry

## 2022-08-09 DIAGNOSIS — R2689 Other abnormalities of gait and mobility: Secondary | ICD-10-CM | POA: Insufficient documentation

## 2022-08-09 DIAGNOSIS — M216X1 Other acquired deformities of right foot: Secondary | ICD-10-CM | POA: Insufficient documentation

## 2022-08-09 DIAGNOSIS — M6788 Other specified disorders of synovium and tendon, other site: Secondary | ICD-10-CM | POA: Diagnosis not present

## 2022-08-09 DIAGNOSIS — M24571 Contracture, right ankle: Secondary | ICD-10-CM | POA: Insufficient documentation

## 2022-08-09 DIAGNOSIS — S92001D Unspecified fracture of right calcaneus, subsequent encounter for fracture with routine healing: Secondary | ICD-10-CM | POA: Insufficient documentation

## 2022-08-09 DIAGNOSIS — M6281 Muscle weakness (generalized): Secondary | ICD-10-CM | POA: Diagnosis present

## 2022-08-09 DIAGNOSIS — S92042A Displaced other fracture of tuberosity of left calcaneus, initial encounter for closed fracture: Secondary | ICD-10-CM | POA: Diagnosis not present

## 2022-08-14 NOTE — Therapy (Signed)
OUTPATIENT PHYSICAL THERAPY LOWER EXTREMITY EVALUATION   Patient Name: Justin Duran MRN: [id number] DOB:2000-03-01, 22 y.o., male Today's Date: 08/16/2022  END OF SESSION:  PT End of Session - 08/16/22 1748     Visit Number 2    Number of Visits 12    Date for PT Re-Evaluation 10/04/22    Authorization Type [employer]    PT Start Time 1747    PT Stop Time 1825    PT Time Calculation (min) 38 min    Activity Tolerance Patient tolerated treatment well    Behavior During Therapy [facility] for tasks assessed/performed             History reviewed. No pertinent past medical history. Past Surgical History:  Procedure Laterality Date   CALCANEAL OSTEOTOMY Right 03/17/2022   Procedure: CALCANEAL OSTEOTOMY;  Surgeon: Edwin Cap, DPM;  Location: [facility];  Service: Podiatry;  Laterality: Right;  Lateral calcaneal displacement osteotomy   CAPSULOTOMY Right 03/17/2022   Procedure: MIDFOOT W/TENDON LENGTHENING & TENDON TRANSFER;  Surgeon: Edwin Cap, DPM;  Location: [facility];  Service: Podiatry;  Laterality: Right;  PT tendon lengthening; peroneus longus to brevis transfer   CARDIAC SURGERY     CIRCUMCISION     HERNIA REPAIR  12/22/1998   Performed at [facility]   METATARSAL OSTEOTOMY Right 03/17/2022   Procedure: BASE WEDGE OSTEOTOMY;  Surgeon: Edwin Cap, DPM;  Location: [facility];  Service: Podiatry;  Laterality: Right;  Dorsiflexion wedge osteotomy   PLANTAR FASCIA RELEASE Right 03/17/2022   Procedure: DIVISION OF PLANTAR FASCIA & MUSCLE;  Surgeon: Edwin Cap, DPM;  Location: [facility];  Service: Podiatry;  Laterality: Right;  Steindler Stripping   Patient Active Problem List   Diagnosis Date Noted   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   Pes cavus of right foot 03/18/2022   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   Plantar flexed metatarsal bone of right foot 03/18/2022   Preventative health care 02/03/2022   History of heart surgery 02/03/2022   Pre-op exam  02/03/2022   Obesity (BMI 30-39.9) 02/03/2022   Marijuana use 02/03/2022   Electronic cigarette use 02/03/2022   Pes cavus, 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   Pain in joint, lower leg 12/22/2013   Hallux hammertoe 12/22/2013    PCP: Eden Emms, NP   REFERRING PROVIDER: Edwin Cap, DPM  REFERRING DIAG: [phone] (ICD-10-CM) - Other closed displaced fracture of tuberosity of left calcaneus, initial encounter M21.6X1 (ICD-10-CM) - Acquired cavovarus deformity of foot, right M67.88 (ICD-10-CM) - Peroneal tendinosis, right M24.571 (ICD-10-CM) - Equinus contracture of right ankle  THERAPY DIAG:  Other abnormalities of gait and mobility  Muscle weakness (generalized)  Closed nondisplaced fracture of right calcaneus with routine healing, unspecified portion of calcaneus, subsequent encounter  Rationale for Evaluation and Treatment: Rehabilitation  ONSET DATE: 03/17/22 surgery date  SUBJECTIVE:   SUBJECTIVE STATEMENT: Has been compliant with WBS in boot.  Reporting little to no arch or achilles symptoms. Mild discomfort at fracture site  PERTINENT HISTORY: Evaluate and treat for 1-2 sessions / week for 4-6 weeks or at therapist's discretion. Patient underwent cavus reconstructive surgery in January with multiple tendon lengthenings and osteotomies, was complicated by postoperative wound and fracture, healed now and should begin physical therapy would like to include ROM, strengthening, stability, and manual therapy. Modalities PRN at therapist's discretion. PAIN:  Are you having pain? Yes: NPRS scale: 3/10 Pain location: R ankle/foot Pain description: ache Aggravating factors: weight bearing Relieving factors:  rest  PRECAUTIONS: Other: WBAT in boot  WEIGHT BEARING RESTRICTIONS: Other: WBAT in boot  FALLS:  Has patient fallen in last 6 months? No  OCCUPATION: not working  PLOF: Independent  PATIENT GOALS: To reduce and manage my foot issues  NEXT MD VISIT: 4 weeks  OBJECTIVE:    DIAGNOSTIC FINDINGS: IMPRESSION: 1. Partial union the posterior calcaneal fracture, with fracture plane traversed by 2 screws and with 1.2 cm of partially fused in cephalad displacement of the posterior fragment. 2. Nonunited first metatarsal osteotomy with a dorsal staple type fixator in place. 3. Tendinopathy and expansion of the peroneus longus tendon. 4. Attenuated attachment of the tibialis posterior to the navicular, correlate clinically in assessing for tibialis posterior dysfunction. 5. Distal Achilles tendinopathy. 6. Indistinctness and thickening of the proximal plantar fascia which attaches in the vicinity of the fracture, biomechanical integrity uncertain. 7. Subcutaneous edema along the heel, especially posterolaterally and along the posteroinferior heel.     Electronically Signed   By: Gaylyn Rong M.D.   On: 05/24/2022 10:53  PATIENT SURVEYS:  FOTO 58(79 predicted)  MUSCLE LENGTH: Hamstrings: Right 60 deg; Left 70 deg  POSTURE:  deferred  PALPATION: deferred  LOWER EXTREMITY ROM:  A/PROM Right eval Left eval  Hip flexion    Hip extension    Hip abduction    Hip adduction    Hip internal rotation    Hip external rotation    Knee flexion    Knee extension    Ankle dorsiflexion 4/6d   Ankle plantarflexion 30d   Ankle inversion    Ankle eversion     (Blank rows = not tested)  LOWER EXTREMITY MMT:  MMT Right eval Left eval  Hip flexion    Hip extension    Hip abduction    Hip adduction    Hip internal rotation    Hip external rotation    Knee flexion    Knee extension    Ankle dorsiflexion 3   Ankle plantarflexion 3   Ankle inversion    Ankle eversion     (Blank rows = not tested)  LOWER EXTREMITY SPECIAL TESTS:  deferred  FUNCTIONAL TESTS:  5 times sit to stand: 15s arms crossed  GAIT: Distance walked: 30ftx2 Assistive device utilized:  CAM boot Level of assistance: Complete Independence Comments: antalgic gait with  decreased R stance time   TODAY'S TREATMENT:         [facility] Adult PT Treatment:                                                DATE: 08/16/22 Therapeutic Exercise: Nustep L2 8 min Long sit PF stretch 30s x3 3 way ankle YTB 15x2 each direction Toe yoga 60s x2 Seated inv/ev over towel, ankle neutral, 15x2  DATE: 08/09/22 Eval and HEP   PATIENT EDUCATION:  Education details: Discussed eval findings, rehab rationale and POC and patient is in agreement  Person educated: Patient Education method: Explanation Education comprehension: verbalized understanding and needs further education  HOME EXERCISE PROGRAM: Access Code: [email or web address] URL: [email or web address] Date: 08/16/2022 Prepared by: Gustavus Bryant  Exercises - Long Sitting Plantar Fascia Stretch with Towel  - 3 x daily - 5 x weekly - 1 sets - 3 reps - 30s hold - Towel Scrunches  - 3 x daily - 5 x weekly - 1 sets - 1 reps - 60s hold - Toe Yoga - Alternating Great Toe and Lesser Toe Extension  - 3 x daily - 5 x weekly - 1 sets - 30 reps - Long Sitting Ankle Inversion with Anchored Resistance  - 2 x daily - 5 x weekly - 2 sets - 15 reps - Long Sitting Ankle Eversion with Resistance  - 2 x daily - 5 x weekly - 2 sets - 15 reps - Long Sitting Ankle Dorsiflexion with Anchored Resistance  - 2 x daily - 5 x weekly - 2 sets - 15 reps  ASSESSMENT:  CLINICAL IMPRESSION: Focus of today ws aerobic training for peripheral circulation, endurance and ROM. Continued stretching tasks as well as strengthening all performed in NWB positions per MD restrictions.  Able to negotiate 16 steps with step to pattern when cued.   Patient is a 22 y.o. male who was seen today for physical therapy evaluation and treatment for R foot reconstruction including plantar fascia release complicated by R calcaneal fx.  He is WBAT in boot with  minimal pain. Patient presents with decreased ROM in R DF/PF with strength deficits in all 4 planes.  Assessment focus on PF/DF due to congenital deformities.  He is compliant with WB status and boot wear, pain and swelling are minimal.  Patient is a good candidate for OPPT to regain ROM strength and function in R ankle/foot.   OBJECTIVE IMPAIRMENTS: Abnormal gait, decreased balance, decreased endurance, decreased knowledge of condition, decreased mobility, difficulty walking, decreased ROM, decreased strength, impaired flexibility, improper body mechanics, postural dysfunction, and pain.   ACTIVITY LIMITATIONS: carrying, lifting, standing, squatting, and stairs  PERSONAL FACTORS: Age, Fitness, Past/current experiences, and Time since onset of injury/illness/exacerbation are also affecting patient's functional outcome.   REHAB POTENTIAL: Good  CLINICAL DECISION MAKING: Evolving/moderate complexity  EVALUATION COMPLEXITY: Low   GOALS: Goals reviewed with patient? No  SHORT TERM GOALS: Target date: 08/30/2022   Patient to demonstrate independence in HEP  Baseline: [id number] Goal status: INITIAL  2.  Patient to negotiate 16 stairs in boot safely with most appropriate pattern. Baseline: TBD; 08/16/22 16 steps with step to pattern and B rail assist Goal status: INITIAL    LONG TERM GOALS: Target date: 09/20/2022    Increase R ankle strength to 4/5 Baseline:   R 08/09/22   Ankle dorsiflexion 3   Ankle plantarflexion 3    Goal status: INITIAL  2.  Increase A/PROM to 8/10d DF Baseline:   R 08/09/22   Ankle dorsiflexion 4/6d   Ankle plantarflexion 30d    Goal status: INITIAL  3.  249ft ambulation w/o CAM boot Baseline: 62ft with CAM boot Goal status: INITIAL  4.  Patient to negotiate 16 steps with most appropriate pattern Baseline: TBD Goal status: INITIAL  5.  Increase FOTO score to 79 Baseline: 54 Goal status: INITIAL     PLAN:  PT FREQUENCY: 1-2x/week  PT  DURATION:  8 weeks  PLANNED INTERVENTIONS: Therapeutic exercises, Therapeutic activity, Neuromuscular re-education, Balance training, Gait training, Patient/Family education, Self Care, Joint mobilization, Stair training, DME instructions, Aquatic Therapy, Dry Needling, Electrical stimulation, Cryotherapy, Moist heat, Manual therapy, and Re-evaluation  PLAN FOR NEXT SESSION: HEP review and update, manual techniques as appropriate, aerobic tasks, ROM and flexibility activities, strengthening and PREs, TPDN, gait and balance training as needed     Hildred Laser, PT 08/16/2022, 6:26 PM   Check all possible CPT codes: 66063 - PT Re-evaluation, 97110- Therapeutic Exercise, [phone]- Neuro Re-education, [phone] - Gait Training, [phone] - Manual Therapy, [phone] - Therapeutic Activities, and [phone] - Self Care    Check all conditions that are expected to impact treatment: {Conditions expected to impact treatment:Musculoskeletal disorders, Contractures, spasticity or fracture relevant to requested treatment, and Structural or anatomic abnormalities   If treatment provided at initial evaluation, no treatment charged due to lack of authorization.

## 2022-08-16 ENCOUNTER — Telehealth: Payer: Self-pay | Admitting: Podiatry

## 2022-08-16 ENCOUNTER — Ambulatory Visit: Payer: 59

## 2022-08-16 DIAGNOSIS — R2689 Other abnormalities of gait and mobility: Secondary | ICD-10-CM | POA: Diagnosis not present

## 2022-08-16 DIAGNOSIS — M6281 Muscle weakness (generalized): Secondary | ICD-10-CM

## 2022-08-16 DIAGNOSIS — S92001D Unspecified fracture of right calcaneus, subsequent encounter for fracture with routine healing: Secondary | ICD-10-CM

## 2022-08-16 NOTE — Telephone Encounter (Signed)
Dr. Allena Katz I see that this pt. Is scheduled to see you, I was wondering about his work status. Is he able to  return to work in a seated position? How much longer will he be oow?

## 2022-08-17 NOTE — Therapy (Signed)
OUTPATIENT PHYSICAL THERAPY LOWER EXTREMITY EVALUATION   Patient Name: Justin Duran MRN: [id number] DOB:20-Feb-2001, 22 y.o., male Today's Date: 08/21/2022  END OF SESSION:  PT End of Session - 08/21/22 1527     Visit Number 3    Number of Visits 12    Date for PT Re-Evaluation 10/04/22    Authorization Type [employer]    PT Start Time 1525    PT Stop Time 1605    PT Time Calculation (min) 40 min             History reviewed. No pertinent past medical history. Past Surgical History:  Procedure Laterality Date   CALCANEAL OSTEOTOMY Right 03/17/2022   Procedure: CALCANEAL OSTEOTOMY;  Surgeon: Edwin Cap, DPM;  Location: [facility];  Service: Podiatry;  Laterality: Right;  Lateral calcaneal displacement osteotomy   CAPSULOTOMY Right 03/17/2022   Procedure: MIDFOOT W/TENDON LENGTHENING & TENDON TRANSFER;  Surgeon: Edwin Cap, DPM;  Location: [facility];  Service: Podiatry;  Laterality: Right;  PT tendon lengthening; peroneus longus to brevis transfer   CARDIAC SURGERY     CIRCUMCISION     HERNIA REPAIR  12/22/1998   Performed at [facility]   METATARSAL OSTEOTOMY Right 03/17/2022   Procedure: BASE WEDGE OSTEOTOMY;  Surgeon: Edwin Cap, DPM;  Location: [facility];  Service: Podiatry;  Laterality: Right;  Dorsiflexion wedge osteotomy   PLANTAR FASCIA RELEASE Right 03/17/2022   Procedure: DIVISION OF PLANTAR FASCIA & MUSCLE;  Surgeon: Edwin Cap, DPM;  Location: [facility];  Service: Podiatry;  Laterality: Right;  Steindler Stripping   Patient Active Problem List   Diagnosis Date Noted   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   Pes cavus of right foot 03/18/2022   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   Plantar flexed metatarsal bone of right foot 03/18/2022   Preventative health care 02/03/2022   History of heart surgery 02/03/2022   Pre-op exam 02/03/2022   Obesity (BMI 30-39.9) 02/03/2022   Marijuana use 02/03/2022   Electronic cigarette use 02/03/2022   Pes  cavus, 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   Pain in joint, lower leg 12/22/2013   Hallux hammertoe 12/22/2013    PCP: Eden Emms, NP   REFERRING PROVIDER: Edwin Cap, DPM  REFERRING DIAG: [phone] (ICD-10-CM) - Other closed displaced fracture of tuberosity of left calcaneus, initial encounter M21.6X1 (ICD-10-CM) - Acquired cavovarus deformity of foot, right M67.88 (ICD-10-CM) - Peroneal tendinosis, right M24.571 (ICD-10-CM) - Equinus contracture of right ankle  THERAPY DIAG:  Other abnormalities of gait and mobility  Muscle weakness (generalized)  Closed nondisplaced fracture of right calcaneus with routine healing, unspecified portion of calcaneus, subsequent encounter  Rationale for Evaluation and Treatment: Rehabilitation  ONSET DATE: 03/17/22 surgery date  SUBJECTIVE:   SUBJECTIVE STATEMENT: Has been compliant with WBS in boot.  Reporting little to no arch or achilles symptoms. Mild discomfort at fracture site  PERTINENT HISTORY: Evaluate and treat for 1-2 sessions / week for 4-6 weeks or at therapist's discretion. Patient underwent cavus reconstructive surgery in January with multiple tendon lengthenings and osteotomies, was complicated by postoperative wound and fracture, healed now and should begin physical therapy would like to include ROM, strengthening, stability, and manual therapy. Modalities PRN at therapist's discretion. PAIN:  Are you having pain? Yes: NPRS scale: 3/10 Pain location: R ankle/foot Pain description: ache Aggravating factors: weight bearing Relieving factors: rest  PRECAUTIONS: Other: WBAT in boot  WEIGHT BEARING RESTRICTIONS: Other: WBAT in boot  FALLS:  Has  patient fallen in last 6 months? No  OCCUPATION: not working  PLOF: Independent  PATIENT GOALS: To reduce and manage my foot issues  NEXT MD VISIT: 4 weeks  OBJECTIVE:   DIAGNOSTIC FINDINGS: IMPRESSION: 1. Partial union the posterior calcaneal fracture, with fracture plane traversed  by 2 screws and with 1.2 cm of partially fused in cephalad displacement of the posterior fragment. 2. Nonunited first metatarsal osteotomy with a dorsal staple type fixator in place. 3. Tendinopathy and expansion of the peroneus longus tendon. 4. Attenuated attachment of the tibialis posterior to the navicular, correlate clinically in assessing for tibialis posterior dysfunction. 5. Distal Achilles tendinopathy. 6. Indistinctness and thickening of the proximal plantar fascia which attaches in the vicinity of the fracture, biomechanical integrity uncertain. 7. Subcutaneous edema along the heel, especially posterolaterally and along the posteroinferior heel.     Electronically Signed   By: Gaylyn Rong M.D.   On: 05/24/2022 10:53  PATIENT SURVEYS:  FOTO 58(79 predicted)  MUSCLE LENGTH: Hamstrings: Right 60 deg; Left 70 deg  POSTURE:  deferred  PALPATION: deferred  LOWER EXTREMITY ROM:  A/PROM Right eval Left eval  Hip flexion    Hip extension    Hip abduction    Hip adduction    Hip internal rotation    Hip external rotation    Knee flexion    Knee extension    Ankle dorsiflexion 4/6d   Ankle plantarflexion 30d   Ankle inversion    Ankle eversion     (Blank rows = not tested)  LOWER EXTREMITY MMT:  MMT Right eval Left eval  Hip flexion    Hip extension    Hip abduction    Hip adduction    Hip internal rotation    Hip external rotation    Knee flexion    Knee extension    Ankle dorsiflexion 3   Ankle plantarflexion 3   Ankle inversion    Ankle eversion     (Blank rows = not tested)  LOWER EXTREMITY SPECIAL TESTS:  deferred  FUNCTIONAL TESTS:  5 times sit to stand: 15s arms crossed  GAIT: Distance walked: 22ftx2 Assistive device utilized:  CAM boot Level of assistance: Complete Independence Comments: antalgic gait with decreased R stance time   TODAY'S TREATMENT:         [facility] Adult PT Treatment:                                                 DATE: 08/21/22 Therapeutic Exercise: NuStep L3 8 min Long sit PF stretch 30s x3 3 way ankle RTB 15x2 each direction R SLR 15x2 S/L R abduction 15x2 FAQs R 15x2 w/adduction PHE 15x2 Standing ham curls 15x2 Standing march B 15x2 alt. legs Tandem and retrowalking at counter fingertip support 5 trips  [facility] Adult PT Treatment:                                                DATE: 08/16/22 Therapeutic Exercise: NuStep L2 8 min Long sit PF stretch 30s x3 3 way ankle YTB 15x2 each direction Toe yoga 60s x2 Seated inv/ev over towel, ankle neutral, 15x2  DATE: 08/09/22 Eval and HEP   PATIENT EDUCATION:  Education details: Discussed eval findings, rehab rationale and POC and patient is in agreement  Person educated: Patient Education method: Explanation Education comprehension: verbalized understanding and needs further education  HOME EXERCISE PROGRAM: Access Code: [email or web address] URL: [email or web address] Date: 08/16/2022 Prepared by: Gustavus Bryant  Exercises - Long Sitting Plantar Fascia Stretch with Towel  - 3 x daily - 5 x weekly - 1 sets - 3 reps - 30s hold - Towel Scrunches  - 3 x daily - 5 x weekly - 1 sets - 1 reps - 60s hold - Toe Yoga - Alternating Great Toe and Lesser Toe Extension  - 3 x daily - 5 x weekly - 1 sets - 30 reps - Long Sitting Ankle Inversion with Anchored Resistance  - 2 x daily - 5 x weekly - 2 sets - 15 reps - Long Sitting Ankle Eversion with Resistance  - 2 x daily - 5 x weekly - 2 sets - 15 reps - Long Sitting Ankle Dorsiflexion with Anchored Resistance  - 2 x daily - 5 x weekly - 2 sets - 15 reps  ASSESSMENT:  CLINICAL IMPRESSION: Focus of today ws aerobic training for peripheral circulation, endurance and ROM. Advanced resistance on ankle strengthening tasks, added additional strengthening to RLE to accommodate  deconditioning, incorporated balance and proprioceptive tasks with gait training at counter.   Patient is a 22 y.o. male who was seen today for physical therapy evaluation and treatment for R foot reconstruction including plantar fascia release complicated by R calcaneal fx.  He is WBAT in boot with minimal pain. Patient presents with decreased ROM in R DF/PF with strength deficits in all 4 planes.  Assessment focus on PF/DF due to congenital deformities.  He is compliant with WB status and boot wear, pain and swelling are minimal.  Patient is a good candidate for OPPT to regain ROM strength and function in R ankle/foot.   OBJECTIVE IMPAIRMENTS: Abnormal gait, decreased balance, decreased endurance, decreased knowledge of condition, decreased mobility, difficulty walking, decreased ROM, decreased strength, impaired flexibility, improper body mechanics, postural dysfunction, and pain.   ACTIVITY LIMITATIONS: carrying, lifting, standing, squatting, and stairs  PERSONAL FACTORS: Age, Fitness, Past/current experiences, and Time since onset of injury/illness/exacerbation are also affecting patient's functional outcome.   REHAB POTENTIAL: Good  CLINICAL DECISION MAKING: Evolving/moderate complexity  EVALUATION COMPLEXITY: Low   GOALS: Goals reviewed with patient? No  SHORT TERM GOALS: Target date: 08/30/2022   Patient to demonstrate independence in HEP  Baseline: [id number] Goal status: INITIAL  2.  Patient to negotiate 16 stairs in boot safely with most appropriate pattern. Baseline: TBD; 08/16/22 16 steps with step to pattern and B rail assist Goal status: INITIAL    LONG TERM GOALS: Target date: 09/20/2022    Increase R ankle strength to 4/5 Baseline:   R 08/09/22   Ankle dorsiflexion 3   Ankle plantarflexion 3    Goal status: INITIAL  2.  Increase A/PROM to 8/10d DF Baseline:   R 08/09/22   Ankle dorsiflexion 4/6d   Ankle plantarflexion 30d    Goal status: INITIAL  3.   267ft ambulation w/o CAM boot Baseline: 1ft with CAM boot Goal status: INITIAL  4.  Patient to negotiate 16 steps with most appropriate pattern Baseline: TBD Goal status: INITIAL  5.  Increase FOTO score to 79 Baseline: 54 Goal status: INITIAL     PLAN:  PT FREQUENCY: 1-2x/week  PT DURATION: 8 weeks  PLANNED INTERVENTIONS: Therapeutic exercises, Therapeutic activity, Neuromuscular re-education, Balance training, Gait training, Patient/Family education, Self Care, Joint mobilization, Stair training, DME instructions, Aquatic Therapy, Dry Needling, Electrical stimulation, Cryotherapy, Moist heat, Manual therapy, and Re-evaluation  PLAN FOR NEXT SESSION: HEP review and update, manual techniques as appropriate, aerobic tasks, ROM and flexibility activities, strengthening and PREs, TPDN, gait and balance training as needed     Hildred Laser, PT 08/21/2022, 4:02 PM   Check all possible CPT codes: 16109 - PT Re-evaluation, 97110- Therapeutic Exercise, [phone]- Neuro Re-education, [phone] - Gait Training, [phone] - Manual Therapy, [phone] - Therapeutic Activities, and [phone] - Self Care    Check all conditions that are expected to impact treatment: {Conditions expected to impact treatment:Musculoskeletal disorders, Contractures, spasticity or fracture relevant to requested treatment, and Structural or anatomic abnormalities   If treatment provided at initial evaluation, no treatment charged due to lack of authorization.

## 2022-08-21 ENCOUNTER — Ambulatory Visit: Payer: 59 | Attending: Podiatry

## 2022-08-21 DIAGNOSIS — R2689 Other abnormalities of gait and mobility: Secondary | ICD-10-CM | POA: Insufficient documentation

## 2022-08-21 DIAGNOSIS — M6281 Muscle weakness (generalized): Secondary | ICD-10-CM | POA: Diagnosis present

## 2022-08-21 DIAGNOSIS — S92001D Unspecified fracture of right calcaneus, subsequent encounter for fracture with routine healing: Secondary | ICD-10-CM | POA: Diagnosis present

## 2022-08-21 DIAGNOSIS — Z419 Encounter for procedure for purposes other than remedying health state, unspecified: Secondary | ICD-10-CM | POA: Diagnosis not present

## 2022-08-23 ENCOUNTER — Ambulatory Visit (INDEPENDENT_AMBULATORY_CARE_PROVIDER_SITE_OTHER): Payer: Medicaid Other | Admitting: Podiatry

## 2022-08-23 ENCOUNTER — Ambulatory Visit (INDEPENDENT_AMBULATORY_CARE_PROVIDER_SITE_OTHER): Payer: 59

## 2022-08-23 DIAGNOSIS — M216X1 Other acquired deformities of right foot: Secondary | ICD-10-CM

## 2022-08-23 DIAGNOSIS — S92042A Displaced other fracture of tuberosity of left calcaneus, initial encounter for closed fracture: Secondary | ICD-10-CM | POA: Diagnosis not present

## 2022-08-23 DIAGNOSIS — Z9889 Other specified postprocedural states: Secondary | ICD-10-CM

## 2022-08-23 DIAGNOSIS — M6788 Other specified disorders of synovium and tendon, other site: Secondary | ICD-10-CM

## 2022-08-23 DIAGNOSIS — M24571 Contracture, right ankle: Secondary | ICD-10-CM

## 2022-08-23 NOTE — Progress Notes (Signed)
Subjective:  Patient ID: Justin Duran, male    DOB: 2000/05/18,  MRN: [id number]  Chief Complaint  Patient presents with   Routine Post Op    22 y.o. male returns for post-op check.  He is doing much better not having much pain  Review of Systems: Negative except as noted in the HPI. Denies N/V/F/Ch.   Objective:  There were no vitals filed for this visit. There is no height or weight on file to calculate BMI. Constitutional Well developed. Well nourished.  Vascular Foot warm and well perfused. Capillary refill normal to all digits.  Calf is soft and supple, no posterior calf or knee pain, negative Homans' sign  Neurologic Normal speech. Oriented to person, place, and time. Epicritic sensation to light touch grossly present bilaterally.  Dermatologic   Posterior heel ulceration is now limited to breakdown of skin, much improved  Orthopedic: Pain is much better     Narrative & Impression  CLINICAL DATA:  Calcaneal fracture   EXAM: CT OF THE RIGHT FOOT WITHOUT CONTRAST   TECHNIQUE: Multidetector CT imaging of the right foot was performed according to the standard protocol. Multiplanar CT image reconstructions were also generated.   RADIATION DOSE REDUCTION: This exam was performed according to the departmental dose-optimization program which includes automated exposure control, adjustment of the mA and/or kV according to patient size and/or use of iterative reconstruction technique.   COMPARISON:  05/23/2022 radiographs and CT from 12/12/2021   FINDINGS: Bones/Joint/Cartilage   Faint residual visibility of portions of the posterior calcaneal fracture, with fracture plane traversed by 2 screws and with the posterior fragment having partially fused in with 1.2 cm of proximal displacement of the posterior fragment. There is some bandlike periosteal reaction or heterotopic calcification along the posteroinferior calcaneus as shown on image 58 series 7, adjacent  to part of the fracture plane.   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 There is cortication along the osteotomy margins.   Bony demineralization noted especially along the cortex in a diffuse manner.   Ligaments   Suboptimally assessed by CT.   Muscles and Tendons   Mild flexor hallucis longus tenosynovitis just proximal to the knot of Henry. Tendinopathy and expansion of the peroneus longus tendon. Attenuated attachment of the tibialis posterior to the navicular, correlate clinically in assessing for tibialis posterior dysfunction.   Distal Achilles tendinopathy. Indistinctness and thickening of the proximal plantar fascia which attaches in the vicinity of the fracture, biomechanical integrity uncertain.   Soft tissues   Subcutaneous edema along the heel, especially posterolaterally and along the posteroinferior heel.   IMPRESSION: 1. Partial union the posterior calcaneal fracture, with fracture plane traversed by 2 screws and with 1.2 cm of partially fused in cephalad displacement of the posterior fragment. 2. Nonunited first metatarsal osteotomy with a dorsal staple type fixator in place. 3. Tendinopathy and expansion of the peroneus longus tendon. 4. Attenuated attachment of the tibialis posterior to the navicular, correlate clinically in assessing for tibialis posterior dysfunction. 5. Distal Achilles tendinopathy. 6. Indistinctness and thickening of the proximal plantar fascia which attaches in the vicinity of the fracture, biomechanical integrity uncertain. 7. Subcutaneous edema along the heel, especially posterolaterally and along the posteroinferior heel.     Electronically Signed   By: Gaylyn Rong M.D.   On: 05/24/2022 10:53     Multiple view plain film radiographs: New films taken today show consolidation across fracture site  Assessment:   1. Other closed displaced  fracture of tuberosity of left calcaneus, initial encounter   2. Acquired cavovarus deformity of foot, right   3. Peroneal tendinosis, right   4. Equinus contracture of right ankle   5. Status post foot surgery     Plan:  Patient was evaluated and treated and all questions answered.  S/p foot surgery right -Overall doing much better skin is nearly fully healed.  Return to regular shoes without any restriction.  Continue physical therapy.  Patient will see Dr. Abbott Pao in few weeks for final clearance.  No follow-ups on file.

## 2022-08-28 ENCOUNTER — Telehealth: Payer: Self-pay

## 2022-08-28 ENCOUNTER — Ambulatory Visit: Payer: 59

## 2022-08-29 ENCOUNTER — Ambulatory Visit: Payer: 59 | Admitting: Internal Medicine

## 2022-08-29 ENCOUNTER — Telehealth: Payer: Self-pay | Admitting: Podiatry

## 2022-08-29 NOTE — Telephone Encounter (Signed)
I am holding for office notes for pts 7/3, office visit. Thank you

## 2022-08-30 ENCOUNTER — Ambulatory Visit: Payer: 59

## 2022-09-01 ENCOUNTER — Ambulatory Visit: Payer: 59 | Admitting: Internal Medicine

## 2022-09-04 ENCOUNTER — Ambulatory Visit: Payer: 59

## 2022-09-04 ENCOUNTER — Encounter: Payer: Self-pay | Admitting: Nurse Practitioner

## 2022-09-04 DIAGNOSIS — M6281 Muscle weakness (generalized): Secondary | ICD-10-CM

## 2022-09-04 DIAGNOSIS — R2689 Other abnormalities of gait and mobility: Secondary | ICD-10-CM | POA: Diagnosis not present

## 2022-09-04 NOTE — Therapy (Signed)
OUTPATIENT PHYSICAL THERAPY TREATMENT   Patient Name: Justin Duran MRN: [id number] DOB:2000/06/15, 22 y.o., male Today's Date: 09/05/2022  END OF SESSION:  PT End of Session - 09/04/22 1652     Visit Number 4    Number of Visits 12    Date for PT Re-Evaluation 10/04/22    Authorization Type [employer]    PT Start Time 1653    PT Stop Time 1732    PT Time Calculation (min) 39 min    Activity Tolerance Patient tolerated treatment well    Behavior During Therapy [facility] for tasks assessed/performed              History reviewed. No pertinent past medical history. Past Surgical History:  Procedure Laterality Date   CALCANEAL OSTEOTOMY Right 03/17/2022   Procedure: CALCANEAL OSTEOTOMY;  Surgeon: Edwin Cap, DPM;  Location: [facility] ORS;  Service: Podiatry;  Laterality: Right;  Lateral calcaneal displacement osteotomy   CAPSULOTOMY Right 03/17/2022   Procedure: MIDFOOT W/TENDON LENGTHENING & TENDON TRANSFER;  Surgeon: Edwin Cap, DPM;  Location: [facility];  Service: Podiatry;  Laterality: Right;  PT tendon lengthening; peroneus longus to brevis transfer   CARDIAC SURGERY     CIRCUMCISION     HERNIA REPAIR  12/22/1998   Performed at [facility]   METATARSAL OSTEOTOMY Right 03/17/2022   Procedure: BASE WEDGE OSTEOTOMY;  Surgeon: Edwin Cap, DPM;  Location: [facility];  Service: Podiatry;  Laterality: Right;  Dorsiflexion wedge osteotomy   PLANTAR FASCIA RELEASE Right 03/17/2022   Procedure: DIVISION OF PLANTAR FASCIA & MUSCLE;  Surgeon: Edwin Cap, DPM;  Location: [facility];  Service: Podiatry;  Laterality: Right;  Steindler Stripping   Patient Active Problem List   Diagnosis Date Noted   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   Pes cavus of right foot 03/18/2022   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   Plantar flexed metatarsal bone of right foot 03/18/2022   Preventative health care 02/03/2022   History of heart surgery 02/03/2022   Pre-op exam 02/03/2022    Obesity (BMI 30-39.9) 02/03/2022   Marijuana use 02/03/2022   Electronic cigarette use 02/03/2022   Pes cavus, 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   Pain in joint, lower leg 12/22/2013   Hallux hammertoe 12/22/2013    PCP: Eden Emms, NP   REFERRING PROVIDER: Edwin Cap, DPM  REFERRING DIAG: [phone] (ICD-10-CM) - Other closed displaced fracture of tuberosity of left calcaneus, initial encounter M21.6X1 (ICD-10-CM) - Acquired cavovarus deformity of foot, right M67.88 (ICD-10-CM) - Peroneal tendinosis, right M24.571 (ICD-10-CM) - Equinus contracture of right ankle  THERAPY DIAG:  Other abnormalities of gait and mobility  Muscle weakness (generalized)  Rationale for Evaluation and Treatment: Rehabilitation  ONSET DATE: 03/17/22 surgery date  SUBJECTIVE:   SUBJECTIVE STATEMENT: Pt presents to PT with reports of decreased R foot/ankle pain. Was not able to put on shoes due to L foot pain. Cleared for out of boot activity.   PERTINENT HISTORY: See PMH  PAIN:  Are you having pain?  Yes: NPRS scale: 3/10 Pain location: R ankle/foot Pain description: ache Aggravating factors: weight bearing Relieving factors: rest  PRECAUTIONS: Other: WBAT in boot  WEIGHT BEARING RESTRICTIONS: Other: WBAT in boot  FALLS:  Has patient fallen in last 6 months? No  OCCUPATION: not working  PLOF: Independent  PATIENT GOALS: To reduce and manage my foot issues  NEXT MD VISIT: 4 weeks  OBJECTIVE:   DIAGNOSTIC FINDINGS: IMPRESSION: 1. Partial union the posterior  calcaneal fracture, with fracture plane traversed by 2 screws and with 1.2 cm of partially fused in cephalad displacement of the posterior fragment. 2. Nonunited first metatarsal osteotomy with a dorsal staple type fixator in place. 3. Tendinopathy and expansion of the peroneus longus tendon. 4. Attenuated attachment of the tibialis posterior to the navicular, correlate clinically in assessing for tibialis  posterior dysfunction. 5. Distal Achilles tendinopathy. 6. Indistinctness and thickening of the proximal plantar fascia which attaches in the vicinity of the fracture, biomechanical integrity uncertain. 7. Subcutaneous edema along the heel, especially posterolaterally and along the posteroinferior heel.     Electronically Signed   By: Gaylyn Rong M.D.   On: 05/24/2022 10:53  PATIENT SURVEYS:  FOTO 58(79 predicted)  MUSCLE LENGTH: Hamstrings: Right 60 deg; Left 70 deg  POSTURE:  deferred  PALPATION: deferred  LOWER EXTREMITY ROM:  A/PROM Right eval Left eval  Hip flexion    Hip extension    Hip abduction    Hip adduction    Hip internal rotation    Hip external rotation    Knee flexion    Knee extension    Ankle dorsiflexion 4/6d   Ankle plantarflexion 30d   Ankle inversion    Ankle eversion     (Blank rows = not tested)  LOWER EXTREMITY MMT:  MMT Right eval Left eval  Hip flexion    Hip extension    Hip abduction    Hip adduction    Hip internal rotation    Hip external rotation    Knee flexion    Knee extension    Ankle dorsiflexion 3   Ankle plantarflexion 3   Ankle inversion    Ankle eversion     (Blank rows = not tested)  LOWER EXTREMITY SPECIAL TESTS:  deferred ` FUNCTIONAL TESTS:  5 times sit to stand: 15s arms crossed  GAIT: Distance walked: 45ftx2 Assistive device utilized:  CAM boot Level of assistance: Complete Independence Comments: antalgic gait with decreased R stance time   TODAY'S TREATMENT:         [facility] Adult PT Treatment:                                                DATE: 09/04/22 Therapeutic Exercise: NuStep L4 x 5 min while taking subjective Long sit PF stretch 30s x3 3 way ankle RTB 15x2 each direction BAPS L4 2x10 cw/ccw R Slant board gastroc stretch 2x30" Heel toe raises 2x15 Tandem on foam 2x30" Tandem walk on foam x 4 laps Step ups 8in 2x10 R leading Step ups lateral 8in 2x10 each Leg press 55#  2x10  [facility] Adult PT Treatment:                                                DATE: 08/21/22 Therapeutic Exercise: NuStep L3 8 min Long sit PF stretch 30s x3 3 way ankle RTB 15x2 each direction R SLR 15x2 S/L R abduction 15x2 FAQs R 15x2 w/adduction PHE 15x2 Standing ham curls 15x2 Standing march B 15x2 alt. legs Tandem and retrowalking at counter fingertip support 5 trips  [facility] Adult PT Treatment:  DATE: 08/16/22 Therapeutic Exercise: NuStep L2 8 min Long sit PF stretch 30s x3 3 way ankle YTB 15x2 each direction Toe yoga 60s x2 Seated inv/ev over towel, ankle neutral, 15x2                                                                                                                      DATE: 08/09/22 Eval and HEP   PATIENT EDUCATION:  Education details: Discussed eval findings, rehab rationale and POC and patient is in agreement  Person educated: Patient Education method: Explanation Education comprehension: verbalized understanding and needs further education  HOME EXERCISE PROGRAM: Access Code: [email or web address] URL: [email or web address] Date: 08/16/2022 Prepared by: Gustavus Bryant  Exercises - Long Sitting Plantar Fascia Stretch with Towel  - 3 x daily - 5 x weekly - 1 sets - 3 reps - 30s hold - Towel Scrunches  - 3 x daily - 5 x weekly - 1 sets - 1 reps - 60s hold - Toe Yoga - Alternating Great Toe and Lesser Toe Extension  - 3 x daily - 5 x weekly - 1 sets - 30 reps - Long Sitting Ankle Inversion with Anchored Resistance  - 2 x daily - 5 x weekly - 2 sets - 15 reps - Long Sitting Ankle Eversion with Resistance  - 2 x daily - 5 x weekly - 2 sets - 15 reps - Long Sitting Ankle Dorsiflexion with Anchored Resistance  - 2 x daily - 5 x weekly - 2 sets - 15 reps  ASSESSMENT:  CLINICAL IMPRESSION: Pt was able to complete all prescribed exercises today with no adverse effect. Therapy focused on continued ankle  strengthening and proprioception work. He progressed well to out of boot activity. Continues to benefit from skilled PT services.   (EVAL): Patient is a 22 y.o. male who was seen today for physical therapy evaluation and treatment for R foot reconstruction including plantar fascia release complicated by R calcaneal fx.  He is WBAT in boot with minimal pain. Patient presents with decreased ROM in R DF/PF with strength deficits in all 4 planes.  Assessment focus on PF/DF due to congenital deformities.  He is compliant with WB status and boot wear, pain and swelling are minimal.  Patient is a good candidate for OPPT to regain ROM strength and function in R ankle/foot.   OBJECTIVE IMPAIRMENTS: Abnormal gait, decreased balance, decreased endurance, decreased knowledge of condition, decreased mobility, difficulty walking, decreased ROM, decreased strength, impaired flexibility, improper body mechanics, postural dysfunction, and pain.   ACTIVITY LIMITATIONS: carrying, lifting, standing, squatting, and stairs  PERSONAL FACTORS: Age, Fitness, Past/current experiences, and Time since onset of injury/illness/exacerbation are also affecting patient's functional outcome.   REHAB POTENTIAL: Good  CLINICAL DECISION MAKING: Evolving/moderate complexity  EVALUATION COMPLEXITY: Low   GOALS: Goals reviewed with patient? No  SHORT TERM GOALS: Target date: 08/30/2022   Patient to demonstrate independence in HEP  Baseline: [email or web address] Goal status: INITIAL  2.  Patient to negotiate 16 stairs in boot  safely with most appropriate pattern. Baseline: TBD; 08/16/22 16 steps with step to pattern and B rail assist Goal status: INITIAL    LONG TERM GOALS: Target date: 09/20/2022    Increase R ankle strength to 4/5 Baseline:   R 08/09/22   Ankle dorsiflexion 3   Ankle plantarflexion 3    Goal status: INITIAL  2.  Increase A/PROM to 8/10d DF Baseline:   R 08/09/22   Ankle dorsiflexion 4/6d   Ankle  plantarflexion 30d    Goal status: INITIAL  3.  251ft ambulation w/o CAM boot Baseline: 12ft with CAM boot Goal status: INITIAL  4.  Patient to negotiate 16 steps with most appropriate pattern Baseline: TBD Goal status: INITIAL  5.  Increase FOTO score to 79 Baseline: 54 Goal status: INITIAL     PLAN:  PT FREQUENCY: 1-2x/week  PT DURATION: 8 weeks  PLANNED INTERVENTIONS: Therapeutic exercises, Therapeutic activity, Neuromuscular re-education, Balance training, Gait training, Patient/Family education, Self Care, Joint mobilization, Stair training, DME instructions, Aquatic Therapy, Dry Needling, Electrical stimulation, Cryotherapy, Moist heat, Manual therapy, and Re-evaluation  PLAN FOR NEXT SESSION: HEP review and update, manual techniques as appropriate, aerobic tasks, ROM and flexibility activities, strengthening and PREs, TPDN, gait and balance training as needed     Eloy End, PT 09/05/2022, 8:19 AM   Check all possible CPT codes: 47829 - PT Re-evaluation, 97110- Therapeutic Exercise, [phone]- Neuro Re-education, [phone] - Gait Training, [phone] - Manual Therapy, 97530 - Therapeutic Activities, and 97535 - Self Care    Check all conditions that are expected to impact treatment: {Conditions expected to impact treatment:Musculoskeletal disorders, Contractures, spasticity or fracture relevant to requested treatment, and Structural or anatomic abnormalities   If treatment provided at initial evaluation, no treatment charged due to lack of authorization.

## 2022-09-05 NOTE — Therapy (Deleted)
OUTPATIENT PHYSICAL THERAPY TREATMENT   Patient Name: Justin Duran MRN: [id number] DOB:[birth year], 22 y.o., male Today's Date: 09/05/2022  END OF SESSION:     No past medical history on file. Past Surgical History:  Procedure Laterality Date   CALCANEAL OSTEOTOMY Right 03/17/2022   Procedure: CALCANEAL OSTEOTOMY;  Surgeon: Edwin Cap, DPM;  Location: [facility];  Service: Podiatry;  Laterality: Right;  Lateral calcaneal displacement osteotomy   CAPSULOTOMY Right 03/17/2022   Procedure: MIDFOOT W/TENDON LENGTHENING & TENDON TRANSFER;  Surgeon: Edwin Cap, DPM;  Location: [facility];  Service: Podiatry;  Laterality: Right;  PT tendon lengthening; peroneus longus to brevis transfer   CARDIAC SURGERY     CIRCUMCISION     HERNIA REPAIR  12/22/1998   Performed at [facility]   METATARSAL OSTEOTOMY Right 03/17/2022   Procedure: BASE WEDGE OSTEOTOMY;  Surgeon: Edwin Cap, DPM;  Location: [facility];  Service: Podiatry;  Laterality: Right;  Dorsiflexion wedge osteotomy   PLANTAR FASCIA RELEASE Right 03/17/2022   Procedure: DIVISION OF PLANTAR FASCIA & MUSCLE;  Surgeon: Edwin Cap, DPM;  Location: [facility];  Service: Podiatry;  Laterality: Right;  Steindler Stripping   Patient Active Problem List   Diagnosis Date Noted   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   Pes cavus of right foot 03/18/2022   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   Plantar flexed metatarsal bone of right foot 03/18/2022   Preventative health care 02/03/2022   History of heart surgery 02/03/2022   Pre-op exam 02/03/2022   Obesity (BMI 30-39.9) 02/03/2022   Marijuana use 02/03/2022   Electronic cigarette use 02/03/2022   Pes cavus, 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  Rationale for Evaluation and Treatment: Rehabilitation  ONSET DATE: 03/17/22 surgery date  SUBJECTIVE:   SUBJECTIVE STATEMENT: Pt presents to PT with reports of decreased R foot/ankle pain. Was not able to put on shoes due to L foot pain. Cleared for out of boot activity.   PERTINENT HISTORY: See PMH  PAIN:  Are you having pain?  Yes: NPRS scale: 3/10 Pain location: R ankle/foot Pain description: ache Aggravating factors: weight bearing Relieving factors: rest  PRECAUTIONS: Other: WBAT in boot  WEIGHT BEARING RESTRICTIONS: Other: WBAT in boot  FALLS:  Has patient fallen in last 6 months? No  OCCUPATION: not working  PLOF: Independent  PATIENT GOALS: To reduce and manage my foot issues  NEXT MD VISIT: 4 weeks  OBJECTIVE:   DIAGNOSTIC FINDINGS: IMPRESSION: 1. Partial union the posterior calcaneal fracture, with fracture plane traversed by 2 screws and with 1.2 cm of partially fused in cephalad displacement of the posterior fragment. 2. Nonunited first metatarsal osteotomy with a dorsal staple type fixator in place. 3. Tendinopathy and expansion of the peroneus longus tendon. 4. Attenuated attachment of the tibialis posterior to the navicular, correlate clinically in assessing for tibialis posterior dysfunction. 5. Distal Achilles tendinopathy. 6. Indistinctness and thickening of the proximal plantar fascia which attaches in the vicinity of the fracture, biomechanical integrity uncertain. 7. Subcutaneous edema along the heel, especially posterolaterally  and along the posteroinferior heel.     Electronically Signed   By: Gaylyn Rong M.D.   On: 05/24/2022 10:53  PATIENT SURVEYS:  FOTO 58(79 predicted)  MUSCLE  LENGTH: Hamstrings: Right 60 deg; Left 70 deg  POSTURE:  deferred  PALPATION: deferred  LOWER EXTREMITY ROM:  A/PROM Right eval Left eval  Hip flexion    Hip extension    Hip abduction    Hip adduction    Hip internal rotation    Hip external rotation    Knee flexion    Knee extension    Ankle dorsiflexion 4/6d   Ankle plantarflexion 30d   Ankle inversion    Ankle eversion     (Blank rows = not tested)  LOWER EXTREMITY MMT:  MMT Right eval Left eval  Hip flexion    Hip extension    Hip abduction    Hip adduction    Hip internal rotation    Hip external rotation    Knee flexion    Knee extension    Ankle dorsiflexion 3   Ankle plantarflexion 3   Ankle inversion    Ankle eversion     (Blank rows = not tested)  LOWER EXTREMITY SPECIAL TESTS:  deferred ` FUNCTIONAL TESTS:  5 times sit to stand: 15s arms crossed  GAIT: Distance walked: 79ftx2 Assistive device utilized:  CAM boot Level of assistance: Complete Independence Comments: antalgic gait with decreased R stance time   TODAY'S TREATMENT:         [facility] Adult PT Treatment:                                                DATE: 09/04/22 Therapeutic Exercise: NuStep L4 x 5 min while taking subjective Long sit PF stretch 30s x3 3 way ankle RTB 15x2 each direction BAPS L4 2x10 cw/ccw R Slant board gastroc stretch 2x30" Heel toe raises 2x15 Tandem on foam 2x30" Tandem walk on foam x 4 laps Step ups 8in 2x10 R leading Step ups lateral 8in 2x10 each Leg press 55# 2x10  [facility] Adult PT Treatment:                                                DATE: 08/21/22 Therapeutic Exercise: NuStep L3 8 min Long sit PF stretch 30s x3 3 way ankle RTB 15x2 each direction R SLR 15x2 S/L R abduction 15x2 FAQs R 15x2 w/adduction PHE 15x2 Standing ham curls 15x2 Standing march B 15x2 alt. legs Tandem and retrowalking at counter fingertip support 5 trips  [facility] Adult PT Treatment:                                                 DATE: 08/16/22 Therapeutic Exercise: NuStep L2 8 min Long sit PF stretch 30s x3 3 way ankle YTB 15x2 each direction Toe yoga 60s x2 Seated inv/ev over towel, ankle neutral, 15x2  DATE: 08/09/22 Eval and HEP   PATIENT EDUCATION:  Education details: Discussed eval findings, rehab rationale and POC and patient is in agreement  Person educated: Patient Education method: Explanation Education comprehension: verbalized understanding and needs further education  HOME EXERCISE PROGRAM: Access Code: [email or web address] URL: [email or web address] Date: 08/16/2022 Prepared by: Gustavus Bryant  Exercises - Long Sitting Plantar Fascia Stretch with Towel  - 3 x daily - 5 x weekly - 1 sets - 3 reps - 30s hold - Towel Scrunches  - 3 x daily - 5 x weekly - 1 sets - 1 reps - 60s hold - Toe Yoga - Alternating Great Toe and Lesser Toe Extension  - 3 x daily - 5 x weekly - 1 sets - 30 reps - Long Sitting Ankle Inversion with Anchored Resistance  - 2 x daily - 5 x weekly - 2 sets - 15 reps - Long Sitting Ankle Eversion with Resistance  - 2 x daily - 5 x weekly - 2 sets - 15 reps - Long Sitting Ankle Dorsiflexion with Anchored Resistance  - 2 x daily - 5 x weekly - 2 sets - 15 reps  ASSESSMENT:  CLINICAL IMPRESSION: Pt was able to complete all prescribed exercises today with no adverse effect. Therapy focused on continued ankle strengthening and proprioception work. He progressed well to out of boot activity. Continues to benefit from skilled PT services.   (EVAL): Patient is a 22 y.o. male who was seen today for physical therapy evaluation and treatment for R foot reconstruction including plantar fascia release complicated by R calcaneal fx.  He is WBAT in boot with minimal pain. Patient presents with decreased ROM in R DF/PF with strength deficits in all 4 planes.  Assessment  focus on PF/DF due to congenital deformities.  He is compliant with WB status and boot wear, pain and swelling are minimal.  Patient is a good candidate for OPPT to regain ROM strength and function in R ankle/foot.   OBJECTIVE IMPAIRMENTS: Abnormal gait, decreased balance, decreased endurance, decreased knowledge of condition, decreased mobility, difficulty walking, decreased ROM, decreased strength, impaired flexibility, improper body mechanics, postural dysfunction, and pain.   ACTIVITY LIMITATIONS: carrying, lifting, standing, squatting, and stairs  PERSONAL FACTORS: Age, Fitness, Past/current experiences, and Time since onset of injury/illness/exacerbation are also affecting patient's functional outcome.   REHAB POTENTIAL: Good  CLINICAL DECISION MAKING: Evolving/moderate complexity  EVALUATION COMPLEXITY: Low   GOALS: Goals reviewed with patient? No  SHORT TERM GOALS: Target date: 08/30/2022   Patient to demonstrate independence in HEP  Baseline: [id number] Goal status: INITIAL  2.  Patient to negotiate 16 stairs in boot safely with most appropriate pattern. Baseline: TBD; 08/16/22 16 steps with step to pattern and B rail assist Goal status: INITIAL    LONG TERM GOALS: Target date: 09/20/2022    Increase R ankle strength to 4/5 Baseline:   R 08/09/22   Ankle dorsiflexion 3   Ankle plantarflexion 3    Goal status: INITIAL  2.  Increase A/PROM to 8/10d DF Baseline:   R 08/09/22   Ankle dorsiflexion 4/6d   Ankle plantarflexion 30d    Goal status: INITIAL  3.  241ft ambulation w/o CAM boot Baseline: 38ft with CAM boot Goal status: INITIAL  4.  Patient to negotiate 16 steps with most appropriate pattern Baseline: TBD Goal status: INITIAL  5.  Increase FOTO score to 79 Baseline: 54 Goal status: INITIAL     PLAN:  PT FREQUENCY: 1-2x/week  PT DURATION: 8 weeks  PLANNED INTERVENTIONS: Therapeutic exercises, Therapeutic activity, Neuromuscular re-education,  Balance training, Gait training, Patient/Family education, Self Care, Joint mobilization, Stair training, DME instructions, Aquatic Therapy, Dry Needling, Electrical stimulation, Cryotherapy, Moist heat, Manual therapy, and Re-evaluation  PLAN FOR NEXT SESSION: HEP review and update, manual techniques as appropriate, aerobic tasks, ROM and flexibility activities, strengthening and PREs, TPDN, gait and balance training as needed     Hildred Laser, PT 09/05/2022, 2:23 PM   Check all possible CPT codes: 30865 - PT Re-evaluation, 97110- Therapeutic Exercise, [phone]- Neuro Re-education, [phone] - Gait Training, [phone] - Manual Therapy, [phone] - Therapeutic Activities, and [phone] - Self Care    Check all conditions that are expected to impact treatment: {Conditions expected to impact treatment:Musculoskeletal disorders, Contractures, spasticity or fracture relevant to requested treatment, and Structural or anatomic abnormalities   If treatment provided at initial evaluation, no treatment charged due to lack of authorization.

## 2022-09-06 ENCOUNTER — Telehealth (HOSPITAL_BASED_OUTPATIENT_CLINIC_OR_DEPARTMENT_OTHER): Payer: Self-pay

## 2022-09-06 ENCOUNTER — Ambulatory Visit: Payer: 59

## 2022-09-06 NOTE — Telephone Encounter (Signed)
TC due to missed visit, spoke directly to patient who forgot appointment.  Declined to reschedule later today.  Reminded of next scheduled appointment.

## 2022-09-07 NOTE — Therapy (Deleted)
OUTPATIENT PHYSICAL THERAPY TREATMENT   Patient Name: Justin Duran MRN: [id number] DOB:12/14/2000, 22 y.o., male Today's Date: 09/07/2022  END OF SESSION:     No past medical history on file. Past Surgical History:  Procedure Laterality Date   CALCANEAL OSTEOTOMY Right 03/17/2022   Procedure: CALCANEAL OSTEOTOMY;  Surgeon: Edwin Cap, DPM;  Location: [facility];  Service: Podiatry;  Laterality: Right;  Lateral calcaneal displacement osteotomy   CAPSULOTOMY Right 03/17/2022   Procedure: MIDFOOT W/TENDON LENGTHENING & TENDON TRANSFER;  Surgeon: Edwin Cap, DPM;  Location: [facility];  Service: Podiatry;  Laterality: Right;  PT tendon lengthening; peroneus longus to brevis transfer   CARDIAC SURGERY     CIRCUMCISION     HERNIA REPAIR  12/22/1998   Performed at [facility]   METATARSAL OSTEOTOMY Right 03/17/2022   Procedure: BASE WEDGE OSTEOTOMY;  Surgeon: Edwin Cap, DPM;  Location: [facility];  Service: Podiatry;  Laterality: Right;  Dorsiflexion wedge osteotomy   PLANTAR FASCIA RELEASE Right 03/17/2022   Procedure: DIVISION OF PLANTAR FASCIA & MUSCLE;  Surgeon: Edwin Cap, DPM;  Location: [facility];  Service: Podiatry;  Laterality: Right;  Steindler Stripping   Patient Active Problem List   Diagnosis Date Noted   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   Pes cavus of right foot 03/18/2022   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   Plantar flexed metatarsal bone of right foot 03/18/2022   Preventative health care 02/03/2022   History of heart surgery 02/03/2022   Pre-op exam 02/03/2022   Obesity (BMI 30-39.9) 02/03/2022   Marijuana use 02/03/2022   Electronic cigarette use 02/03/2022   Pes cavus, 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  Rationale for Evaluation and Treatment: Rehabilitation  ONSET DATE: 03/17/22 surgery date  SUBJECTIVE:   SUBJECTIVE STATEMENT: Pt presents to PT with reports of decreased R foot/ankle pain. Was not able to put on shoes due to L foot pain. Cleared for out of boot activity.   PERTINENT HISTORY: See PMH  PAIN:  Are you having pain?  Yes: NPRS scale: 3/10 Pain location: R ankle/foot Pain description: ache Aggravating factors: weight bearing Relieving factors: rest  PRECAUTIONS: Other: WBAT in boot  WEIGHT BEARING RESTRICTIONS: Other: WBAT in boot  FALLS:  Has patient fallen in last 6 months? No  OCCUPATION: not working  PLOF: Independent  PATIENT GOALS: To reduce and manage my foot issues  NEXT MD VISIT: 4 weeks  OBJECTIVE:   DIAGNOSTIC FINDINGS: IMPRESSION: 1. Partial union the posterior calcaneal fracture, with fracture plane traversed by 2 screws and with 1.2 cm of partially fused in cephalad displacement of the posterior fragment. 2. Nonunited first metatarsal osteotomy with a dorsal staple type fixator in place. 3. Tendinopathy and expansion of the peroneus longus tendon. 4. Attenuated attachment of the tibialis posterior to the navicular, correlate clinically in assessing for tibialis posterior dysfunction. 5. Distal Achilles tendinopathy. 6. Indistinctness and thickening of the proximal plantar fascia which attaches in the vicinity of the fracture, biomechanical integrity uncertain. 7. Subcutaneous edema along the heel, especially posterolaterally  and along the posteroinferior heel.     Electronically Signed   By: Gaylyn Rong M.D.   On: 05/24/2022 10:53  PATIENT SURVEYS:  FOTO 58(79 predicted)  MUSCLE  LENGTH: Hamstrings: Right 60 deg; Left 70 deg  POSTURE:  deferred  PALPATION: deferred  LOWER EXTREMITY ROM:  A/PROM Right eval Left eval  Hip flexion    Hip extension    Hip abduction    Hip adduction    Hip internal rotation    Hip external rotation    Knee flexion    Knee extension    Ankle dorsiflexion 4/6d   Ankle plantarflexion 30d   Ankle inversion    Ankle eversion     (Blank rows = not tested)  LOWER EXTREMITY MMT:  MMT Right eval Left eval  Hip flexion    Hip extension    Hip abduction    Hip adduction    Hip internal rotation    Hip external rotation    Knee flexion    Knee extension    Ankle dorsiflexion 3   Ankle plantarflexion 3   Ankle inversion    Ankle eversion     (Blank rows = not tested)  LOWER EXTREMITY SPECIAL TESTS:  deferred ` FUNCTIONAL TESTS:  5 times sit to stand: 15s arms crossed  GAIT: Distance walked: 80ftx2 Assistive device utilized:  CAM boot Level of assistance: Complete Independence Comments: antalgic gait with decreased R stance time   TODAY'S TREATMENT:         [facility] Adult PT Treatment:                                                DATE: 09/04/22 Therapeutic Exercise: NuStep L4 x 5 min while taking subjective Long sit PF stretch 30s x3 3 way ankle RTB 15x2 each direction BAPS L4 2x10 cw/ccw R Slant board gastroc stretch 2x30" Heel toe raises 2x15 Tandem on foam 2x30" Tandem walk on foam x 4 laps Step ups 8in 2x10 R leading Step ups lateral 8in 2x10 each Leg press 55# 2x10  [facility] Adult PT Treatment:                                                DATE: 08/21/22 Therapeutic Exercise: NuStep L3 8 min Long sit PF stretch 30s x3 3 way ankle RTB 15x2 each direction R SLR 15x2 S/L R abduction 15x2 FAQs R 15x2 w/adduction PHE 15x2 Standing ham curls 15x2 Standing march B 15x2 alt. legs Tandem and retrowalking at counter fingertip support 5 trips  [facility] Adult PT Treatment:                                                 DATE: 08/16/22 Therapeutic Exercise: NuStep L2 8 min Long sit PF stretch 30s x3 3 way ankle YTB 15x2 each direction Toe yoga 60s x2 Seated inv/ev over towel, ankle neutral, 15x2  DATE: 08/09/22 Eval and HEP   PATIENT EDUCATION:  Education details: Discussed eval findings, rehab rationale and POC and patient is in agreement  Person educated: Patient Education method: Explanation Education comprehension: verbalized understanding and needs further education  HOME EXERCISE PROGRAM: Access Code: [email or web address] URL: [email or web address] Date: 08/16/2022 Prepared by: Gustavus Bryant  Exercises - Long Sitting Plantar Fascia Stretch with Towel  - 3 x daily - 5 x weekly - 1 sets - 3 reps - 30s hold - Towel Scrunches  - 3 x daily - 5 x weekly - 1 sets - 1 reps - 60s hold - Toe Yoga - Alternating Great Toe and Lesser Toe Extension  - 3 x daily - 5 x weekly - 1 sets - 30 reps - Long Sitting Ankle Inversion with Anchored Resistance  - 2 x daily - 5 x weekly - 2 sets - 15 reps - Long Sitting Ankle Eversion with Resistance  - 2 x daily - 5 x weekly - 2 sets - 15 reps - Long Sitting Ankle Dorsiflexion with Anchored Resistance  - 2 x daily - 5 x weekly - 2 sets - 15 reps  ASSESSMENT:  CLINICAL IMPRESSION: Pt was able to complete all prescribed exercises today with no adverse effect. Therapy focused on continued ankle strengthening and proprioception work. He progressed well to out of boot activity. Continues to benefit from skilled PT services.   (EVAL): Patient is a 22 y.o. male who was seen today for physical therapy evaluation and treatment for R foot reconstruction including plantar fascia release complicated by R calcaneal fx.  He is WBAT in boot with minimal pain. Patient presents with decreased ROM in R DF/PF with strength deficits in all 4 planes.  Assessment  focus on PF/DF due to congenital deformities.  He is compliant with WB status and boot wear, pain and swelling are minimal.  Patient is a good candidate for OPPT to regain ROM strength and function in R ankle/foot.   OBJECTIVE IMPAIRMENTS: Abnormal gait, decreased balance, decreased endurance, decreased knowledge of condition, decreased mobility, difficulty walking, decreased ROM, decreased strength, impaired flexibility, improper body mechanics, postural dysfunction, and pain.   ACTIVITY LIMITATIONS: carrying, lifting, standing, squatting, and stairs  PERSONAL FACTORS: Age, Fitness, Past/current experiences, and Time since onset of injury/illness/exacerbation are also affecting patient's functional outcome.   REHAB POTENTIAL: Good  CLINICAL DECISION MAKING: Evolving/moderate complexity  EVALUATION COMPLEXITY: Low   GOALS: Goals reviewed with patient? No  SHORT TERM GOALS: Target date: 08/30/2022   Patient to demonstrate independence in HEP  Baseline: [id number] Goal status: INITIAL  2.  Patient to negotiate 16 stairs in boot safely with most appropriate pattern. Baseline: TBD; 08/16/22 16 steps with step to pattern and B rail assist Goal status: INITIAL    LONG TERM GOALS: Target date: 09/20/2022    Increase R ankle strength to 4/5 Baseline:   R 08/09/22   Ankle dorsiflexion 3   Ankle plantarflexion 3    Goal status: INITIAL  2.  Increase A/PROM to 8/10d DF Baseline:   R 08/09/22   Ankle dorsiflexion 4/6d   Ankle plantarflexion 30d    Goal status: INITIAL  3.  281ft ambulation w/o CAM boot Baseline: 65ft with CAM boot Goal status: INITIAL  4.  Patient to negotiate 16 steps with most appropriate pattern Baseline: TBD Goal status: INITIAL  5.  Increase FOTO score to 79 Baseline: 54 Goal status: INITIAL     PLAN:  PT FREQUENCY: 1-2x/week  PT DURATION: 8 weeks  PLANNED INTERVENTIONS: Therapeutic exercises, Therapeutic activity, Neuromuscular re-education,  Balance training, Gait training, Patient/Family education, Self Care, Joint mobilization, Stair training, DME instructions, Aquatic Therapy, Dry Needling, Electrical stimulation, Cryotherapy, Moist heat, Manual therapy, and Re-evaluation  PLAN FOR NEXT SESSION: HEP review and update, manual techniques as appropriate, aerobic tasks, ROM and flexibility activities, strengthening and PREs, TPDN, gait and balance training as needed     Hildred Laser, PT 09/07/2022, 12:05 PM   Check all possible CPT codes: 27253 - PT Re-evaluation, 97110- Therapeutic Exercise, [phone]- Neuro Re-education, [phone] - Gait Training, [phone] - Manual Therapy, [phone] - Therapeutic Activities, and [phone] - Self Care    Check all conditions that are expected to impact treatment: {Conditions expected to impact treatment:Musculoskeletal disorders, Contractures, spasticity or fracture relevant to requested treatment, and Structural or anatomic abnormalities   If treatment provided at initial evaluation, no treatment charged due to lack of authorization.

## 2022-09-11 ENCOUNTER — Ambulatory Visit: Payer: 59

## 2022-09-11 NOTE — Therapy (Unsigned)
OUTPATIENT PHYSICAL THERAPY TREATMENT   Patient Name: Justin Duran MRN: [id number] DOB:03/31/2000, 22 y.o., male Today's Date: 09/13/2022  END OF SESSION:  PT End of Session - 09/13/22 1701     Visit Number 5    Number of Visits 12    Date for PT Re-Evaluation 10/04/22    Authorization Type U[employer]    PT Start Time 1700    PT Stop Time 1740    PT Time Calculation (min) 40 min    Activity Tolerance Patient tolerated treatment well    Behavior During Therapy [facility] for tasks assessed/performed               History reviewed. No pertinent past medical history. Past Surgical History:  Procedure Laterality Date   CALCANEAL OSTEOTOMY Right 03/17/2022   Procedure: CALCANEAL OSTEOTOMY;  Surgeon: Edwin Cap, DPM;  Location: [facility] ORS;  Service: Podiatry;  Laterality: Right;  Lateral calcaneal displacement osteotomy   CAPSULOTOMY Right 03/17/2022   Procedure: MIDFOOT W/TENDON LENGTHENING & TENDON TRANSFER;  Surgeon: Edwin Cap, DPM;  Location: [facility] ORS;  Service: Podiatry;  Laterality: Right;  PT tendon lengthening; peroneus longus to brevis transfer   CARDIAC SURGERY     CIRCUMCISION     HERNIA REPAIR  12/22/1998   Performed at [facility]   METATARSAL OSTEOTOMY Right 03/17/2022   Procedure: BASE WEDGE OSTEOTOMY;  Surgeon: Edwin Cap, DPM;  Location: [facility] ORS;  Service: Podiatry;  Laterality: Right;  Dorsiflexion wedge osteotomy   PLANTAR FASCIA RELEASE Right 03/17/2022   Procedure: DIVISION OF PLANTAR FASCIA & MUSCLE;  Surgeon: Edwin Cap, DPM;  Location: [facility] ORS;  Service: Podiatry;  Laterality: Right;  Steindler Stripping   Patient Active Problem List   Diagnosis Date Noted   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   Pes cavus of right foot 03/18/2022   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   Plantar flexed metatarsal bone of right foot 03/18/2022   Preventative health care 02/03/2022   History of heart surgery 02/03/2022   Pre-op exam 02/03/2022    Obesity (BMI 30-39.9) 02/03/2022   Marijuana use 02/03/2022   Electronic cigarette use 02/03/2022   Pes cavus, 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   Pain in joint, lower leg 12/22/2013   Hallux hammertoe 12/22/2013    PCP: Eden Emms, NP   REFERRING PROVIDER: Edwin Cap, DPM  REFERRING DIAG: [phone] (ICD-10-CM) - Other closed displaced fracture of tuberosity of left calcaneus, initial encounter M21.6X1 (ICD-10-CM) - Acquired cavovarus deformity of foot, right M67.88 (ICD-10-CM) - Peroneal tendinosis, right M24.571 (ICD-10-CM) - Equinus contracture of right ankle  THERAPY DIAG:  Other abnormalities of gait and mobility  Muscle weakness (generalized)  Closed nondisplaced fracture of right calcaneus with routine healing, unspecified portion of calcaneus, subsequent encounter  Rationale for Evaluation and Treatment: Rehabilitation  ONSET DATE: 03/17/22 surgery date  SUBJECTIVE:   SUBJECTIVE STATEMENT: No distinct R foot pain reported.  Some L sided symptoms noted when wearing sneakers localized to L fifth ray and ball of foot.  PERTINENT HISTORY: See PMH  PAIN:  Are you having pain?  Yes: NPRS scale: 3/10 Pain location: R ankle/foot Pain description: ache Aggravating factors: weight bearing Relieving factors: rest  PRECAUTIONS: Other: WBAT in boot  WEIGHT BEARING RESTRICTIONS: Other: WBAT in boot  FALLS:  Has patient fallen in last 6 months? No  OCCUPATION: not working  PLOF: Independent  PATIENT GOALS: To reduce and manage my foot issues  NEXT MD VISIT: 4 weeks  OBJECTIVE:   DIAGNOSTIC FINDINGS: IMPRESSION: 1. Partial union the posterior calcaneal fracture, with fracture plane traversed by 2 screws and with 1.2 cm of partially fused in cephalad displacement of the posterior fragment. 2. Nonunited first metatarsal osteotomy with a dorsal staple type fixator in place. 3. Tendinopathy and expansion of the peroneus longus tendon. 4. Attenuated attachment of the  tibialis posterior to the navicular, correlate clinically in assessing for tibialis posterior dysfunction. 5. Distal Achilles tendinopathy. 6. Indistinctness and thickening of the proximal plantar fascia which attaches in the vicinity of the fracture, biomechanical integrity uncertain. 7. Subcutaneous edema along the heel, especially posterolaterally and along the posteroinferior heel.     Electronically Signed   By: Gaylyn Rong M.D.   On: 05/24/2022 10:53  PATIENT SURVEYS:  FOTO 58(79 predicted)  MUSCLE LENGTH: Hamstrings: Right 60 deg; Left 70 deg  POSTURE:  deferred  PALPATION: deferred  LOWER EXTREMITY ROM:  A/PROM Right eval Left eval  Hip flexion    Hip extension    Hip abduction    Hip adduction    Hip internal rotation    Hip external rotation    Knee flexion    Knee extension    Ankle dorsiflexion 4/6d   Ankle plantarflexion 30d   Ankle inversion    Ankle eversion     (Blank rows = not tested)  LOWER EXTREMITY MMT:  MMT Right eval Left eval  Hip flexion    Hip extension    Hip abduction    Hip adduction    Hip internal rotation    Hip external rotation    Knee flexion    Knee extension    Ankle dorsiflexion 3   Ankle plantarflexion 3   Ankle inversion    Ankle eversion     (Blank rows = not tested)  LOWER EXTREMITY SPECIAL TESTS:  deferred ` FUNCTIONAL TESTS:  5 times sit to stand: 15s arms crossed  GAIT: Distance walked: 10ftx2 Assistive device utilized:  CAM boot Level of assistance: Complete Independence Comments: antalgic gait with decreased R stance time   TODAY'S TREATMENT:      [facility] Adult PT Treatment:                                                DATE: 09/13/22 Therapeutic Exercise: NuStep L4 x 8 min while taking subjective Runners step 6 in 15/15 5# KB Step overs 6 in 10/10 BAPS L1 15/15 in standing starting with A/P and M/L directions Heel raises 4 in 15x Semi Tandem on foam 2x30" EO/EC Leg press 55# 15x  B, 35# unilaterally 15/15 10x STS arms crossed      [facility] Adult PT Treatment:                                                DATE: 09/04/22 Therapeutic Exercise: NuStep L4 x 5 min while taking subjective Long sit PF stretch 30s x3 3 way ankle RTB 15x2 each direction BAPS L4 2x10 cw/ccw R Slant board gastroc stretch 2x30" Heel toe raises 2x15 Tandem on foam 2x30" Tandem walk on foam x 4 laps Step ups 8in 2x10 R leading Step ups lateral 8in 2x10 each Leg press 55# 2x10  [facility] Adult PT  Treatment:                                                DATE: 08/21/22 Therapeutic Exercise: NuStep L3 8 min Long sit PF stretch 30s x3 3 way ankle RTB 15x2 each direction R SLR 15x2 S/L R abduction 15x2 FAQs R 15x2 w/adduction PHE 15x2 Standing ham curls 15x2 Standing march B 15x2 alt. legs Tandem and retrowalking at counter fingertip support 5 trips  [facility] Adult PT Treatment:                                                DATE: 08/16/22 Therapeutic Exercise: NuStep L2 8 min Long sit PF stretch 30s x3 3 way ankle YTB 15x2 each direction Toe yoga 60s x2 Seated inv/ev over towel, ankle neutral, 15x2                                                                                                                      DATE: 08/09/22 Eval and HEP   PATIENT EDUCATION:  Education details: Discussed eval findings, rehab rationale and POC and patient is in agreement  Person educated: Patient Education method: Explanation Education comprehension: verbalized understanding and needs further education  HOME EXERCISE PROGRAM: Access Code: [email or web address] URL: [email or web address] Date: 08/16/2022 Prepared by: Gustavus Bryant  Exercises - Long Sitting Plantar Fascia Stretch with Towel  - 3 x daily - 5 x weekly - 1 sets - 3 reps - 30s hold - Towel Scrunches  - 3 x daily - 5 x weekly - 1 sets - 1 reps - 60s hold - Toe Yoga - Alternating Great Toe and Lesser Toe Extension  - 3 x daily - 5 x weekly  - 1 sets - 30 reps - Long Sitting Ankle Inversion with Anchored Resistance  - 2 x daily - 5 x weekly - 2 sets - 15 reps - Long Sitting Ankle Eversion with Resistance  - 2 x daily - 5 x weekly - 2 sets - 15 reps - Long Sitting Ankle Dorsiflexion with Anchored Resistance  - 2 x daily - 5 x weekly - 2 sets - 15 reps  ASSESSMENT:  CLINICAL IMPRESSION: Focus of today was advancing strengthening exercises and challenging functional activities.  Introduce runners step to challenge strength and proprioception.  (EVAL): Patient is a 22 y.o. male who was seen today for physical therapy evaluation and treatment for R foot reconstruction including plantar fascia release complicated by R calcaneal fx.  He is WBAT in boot with minimal pain. Patient presents with decreased ROM in R DF/PF with strength deficits in all 4 planes.  Assessment focus on PF/DF due to congenital deformities.  He is compliant with WB status and boot wear, pain  and swelling are minimal.  Patient is a good candidate for OPPT to regain ROM strength and function in R ankle/foot.   OBJECTIVE IMPAIRMENTS: Abnormal gait, decreased balance, decreased endurance, decreased knowledge of condition, decreased mobility, difficulty walking, decreased ROM, decreased strength, impaired flexibility, improper body mechanics, postural dysfunction, and pain.   ACTIVITY LIMITATIONS: carrying, lifting, standing, squatting, and stairs  PERSONAL FACTORS: Age, Fitness, Past/current experiences, and Time since onset of injury/illness/exacerbation are also affecting patient's functional outcome.   REHAB POTENTIAL: Good  CLINICAL DECISION MAKING: Evolving/moderate complexity  EVALUATION COMPLEXITY: Low   GOALS: Goals reviewed with patient? No  SHORT TERM GOALS: Target date: 08/30/2022   Patient to demonstrate independence in HEP  Baseline: [id number] Goal status: Met  2.  Patient to negotiate 16 stairs in boot safely with most appropriate  pattern. Baseline: TBD; 08/16/22 16 steps with step to pattern and B rail assist Goal status: INITIAL    LONG TERM GOALS: Target date: 09/20/2022    Increase R ankle strength to 4/5 Baseline:   R 08/09/22   Ankle dorsiflexion 3   Ankle plantarflexion 3    Goal status: INITIAL  2.  Increase A/PROM to 8/10d DF Baseline:   R 08/09/22   Ankle dorsiflexion 4/6d   Ankle plantarflexion 30d    Goal status: INITIAL  3.  218ft ambulation w/o CAM boot Baseline: 6ft with CAM boot; 09/13/22 Unlimited w/o boot Goal status: Met  4.  Patient to negotiate 16 steps with most appropriate pattern Baseline: TBD Goal status: INITIAL  5.  Increase FOTO score to 79 Baseline: 54 Goal status: INITIAL     PLAN:  PT FREQUENCY: 1-2x/week  PT DURATION: 8 weeks  PLANNED INTERVENTIONS: Therapeutic exercises, Therapeutic activity, Neuromuscular re-education, Balance training, Gait training, Patient/Family education, Self Care, Joint mobilization, Stair training, DME instructions, Aquatic Therapy, Dry Needling, Electrical stimulation, Cryotherapy, Moist heat, Manual therapy, and Re-evaluation  PLAN FOR NEXT SESSION: HEP review and update, manual techniques as appropriate, aerobic tasks, ROM and flexibility activities, strengthening and PREs, TPDN, gait and balance training as needed     Hildred Laser, PT 09/13/2022, 5:41 PM   Check all possible CPT codes: 73220 - PT Re-evaluation, 97110- Therapeutic Exercise, [phone]- Neuro Re-education, [phone] - Gait Training, [phone] - Manual Therapy, 97530 - Therapeutic Activities, and 97535 - Self Care    Check all conditions that are expected to impact treatment: {Conditions expected to impact treatment:Musculoskeletal disorders, Contractures, spasticity or fracture relevant to requested treatment, and Structural or anatomic abnormalities   If treatment provided at initial evaluation, no treatment charged due to lack of authorization.

## 2022-09-13 ENCOUNTER — Ambulatory Visit: Payer: 59

## 2022-09-13 DIAGNOSIS — R2689 Other abnormalities of gait and mobility: Secondary | ICD-10-CM | POA: Diagnosis not present

## 2022-09-13 DIAGNOSIS — M6281 Muscle weakness (generalized): Secondary | ICD-10-CM

## 2022-09-13 DIAGNOSIS — S92001D Unspecified fracture of right calcaneus, subsequent encounter for fracture with routine healing: Secondary | ICD-10-CM

## 2022-09-18 ENCOUNTER — Ambulatory Visit: Payer: 59

## 2022-09-18 DIAGNOSIS — S92001D Unspecified fracture of right calcaneus, subsequent encounter for fracture with routine healing: Secondary | ICD-10-CM

## 2022-09-18 DIAGNOSIS — R2689 Other abnormalities of gait and mobility: Secondary | ICD-10-CM

## 2022-09-18 DIAGNOSIS — M6281 Muscle weakness (generalized): Secondary | ICD-10-CM

## 2022-09-18 NOTE — Therapy (Signed)
OUTPATIENT PHYSICAL THERAPY TREATMENT   Patient Name: Justin Duran MRN: [id number] DOB:2000-06-05, 22 y.o., male Today's Date: 09/18/2022  END OF SESSION:  PT End of Session - 09/18/22 1658     Visit Number 6    Number of Visits 12    Date for PT Re-Evaluation 10/04/22    Authorization Type U[employer]    PT Start Time 1655    PT Stop Time 1735    PT Time Calculation (min) 40 min    Activity Tolerance Patient tolerated treatment well    Behavior During Therapy [facility] for tasks assessed/performed                History reviewed. No pertinent past medical history. Past Surgical History:  Procedure Laterality Date   CALCANEAL OSTEOTOMY Right 03/17/2022   Procedure: CALCANEAL OSTEOTOMY;  Surgeon: Edwin Cap, DPM;  Location: [facility] ORS;  Service: Podiatry;  Laterality: Right;  Lateral calcaneal displacement osteotomy   CAPSULOTOMY Right 03/17/2022   Procedure: MIDFOOT W/TENDON LENGTHENING & TENDON TRANSFER;  Surgeon: Edwin Cap, DPM;  Location: [facility] ORS;  Service: Podiatry;  Laterality: Right;  PT tendon lengthening; peroneus longus to brevis transfer   CARDIAC SURGERY     CIRCUMCISION     HERNIA REPAIR  12/22/1998   Performed at [facility]   METATARSAL OSTEOTOMY Right 03/17/2022   Procedure: BASE WEDGE OSTEOTOMY;  Surgeon: Edwin Cap, DPM;  Location: [facility] ORS;  Service: Podiatry;  Laterality: Right;  Dorsiflexion wedge osteotomy   PLANTAR FASCIA RELEASE Right 03/17/2022   Procedure: DIVISION OF PLANTAR FASCIA & MUSCLE;  Surgeon: Edwin Cap, DPM;  Location: [facility] ORS;  Service: Podiatry;  Laterality: Right;  Steindler Stripping   Patient Active Problem List   Diagnosis Date Noted   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   Pes cavus of right foot 03/18/2022   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   Plantar flexed metatarsal bone of right foot 03/18/2022   Preventative health care 02/03/2022   History of heart surgery 02/03/2022   Pre-op exam 02/03/2022    Obesity (BMI 30-39.9) 02/03/2022   Marijuana use 02/03/2022   Electronic cigarette use 02/03/2022   Pes cavus, 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   Pain in joint, lower leg 12/22/2013   Hallux hammertoe 12/22/2013    PCP: Eden Emms, NP   REFERRING PROVIDER: Edwin Cap, DPM  REFERRING DIAG: [phone] (ICD-10-CM) - Other closed displaced fracture of tuberosity of left calcaneus, initial encounter M21.6X1 (ICD-10-CM) - Acquired cavovarus deformity of foot, right M67.88 (ICD-10-CM) - Peroneal tendinosis, right M24.571 (ICD-10-CM) - Equinus contracture of right ankle  THERAPY DIAG:  Muscle weakness (generalized)  Closed nondisplaced fracture of right calcaneus with routine healing, unspecified portion of calcaneus, subsequent encounter  Other abnormalities of gait and mobility  Rationale for Evaluation and Treatment: Rehabilitation  ONSET DATE: 03/17/22 surgery date  SUBJECTIVE:   SUBJECTIVE STATEMENT: Has worn shoes all day w/o previously reported symptoms.  Rates himself at 80%  PERTINENT HISTORY: See PMH  PAIN:  Are you having pain?  Yes: NPRS scale: 3/10 Pain location: R ankle/foot Pain description: ache Aggravating factors: weight bearing Relieving factors: rest  PRECAUTIONS: Other: WBAT in boot  WEIGHT BEARING RESTRICTIONS: Other: WBAT in boot  FALLS:  Has patient fallen in last 6 months? No  OCCUPATION: not working  PLOF: Independent  PATIENT GOALS: To reduce and manage my foot issues  NEXT MD VISIT: 4 weeks  OBJECTIVE:   DIAGNOSTIC FINDINGS: IMPRESSION: 1. Partial union  the posterior calcaneal fracture, with fracture plane traversed by 2 screws and with 1.2 cm of partially fused in cephalad displacement of the posterior fragment. 2. Nonunited first metatarsal osteotomy with a dorsal staple type fixator in place. 3. Tendinopathy and expansion of the peroneus longus tendon. 4. Attenuated attachment of the tibialis posterior to the navicular, correlate  clinically in assessing for tibialis posterior dysfunction. 5. Distal Achilles tendinopathy. 6. Indistinctness and thickening of the proximal plantar fascia which attaches in the vicinity of the fracture, biomechanical integrity uncertain. 7. Subcutaneous edema along the heel, especially posterolaterally and along the posteroinferior heel.     Electronically Signed   By: Gaylyn Rong M.D.   On: 05/24/2022 10:53  PATIENT SURVEYS:  FOTO 58(79 predicted)  MUSCLE LENGTH: Hamstrings: Right 60 deg; Left 70 deg  POSTURE:  deferred  PALPATION: deferred  LOWER EXTREMITY ROM:  A/PROM Right eval Left eval  Hip flexion    Hip extension    Hip abduction    Hip adduction    Hip internal rotation    Hip external rotation    Knee flexion    Knee extension    Ankle dorsiflexion 4/6d   Ankle plantarflexion 30d   Ankle inversion    Ankle eversion     (Blank rows = not tested)  LOWER EXTREMITY MMT:  MMT Right eval Left eval  Hip flexion    Hip extension    Hip abduction    Hip adduction    Hip internal rotation    Hip external rotation    Knee flexion    Knee extension    Ankle dorsiflexion 3   Ankle plantarflexion 3   Ankle inversion    Ankle eversion     (Blank rows = not tested)  LOWER EXTREMITY SPECIAL TESTS:  deferred ` FUNCTIONAL TESTS:  5 times sit to stand: 15s arms crossed  GAIT: Distance walked: 85ftx2 Assistive device utilized:  CAM boot Level of assistance: Complete Independence Comments: antalgic gait with decreased R stance time   TODAY'S TREATMENT:      [facility] Adult PT Treatment:                                                DATE: 09/18/22 Therapeutic Exercise: NuStep L5 x 8 min while taking subjective Cable walks 4 way 5 rep each 13 Runners step 8 in 15/15 10# KB Step overs 8in 10/10 Leg press 65# 15x B, 45# unilaterally 15/15  [facility] Adult PT Treatment:                                                DATE: 09/13/22 Therapeutic  Exercise: NuStep L4 x 8 min while taking subjective Runners step 6 in 15/15 5# KB Step overs 6 in 10/10 BAPS L1 15/15 in standing starting with A/P and M/L directions Heel raises 4 in 15x Semi Tandem on foam 2x30" EO/EC Leg press 55# 15x B, 35# unilaterally 15/15 10x STS arms crossed      [facility] Adult PT Treatment:  DATE: 09/04/22 Therapeutic Exercise: NuStep L4 x 5 min while taking subjective Long sit PF stretch 30s x3 3 way ankle RTB 15x2 each direction BAPS L4 2x10 cw/ccw R Slant board gastroc stretch 2x30" Heel toe raises 2x15 Tandem on foam 2x30" Tandem walk on foam x 4 laps Step ups 8in 2x10 R leading Step ups lateral 8in 2x10 each Leg press 55# 2x10  [facility] Adult PT Treatment:                                                DATE: 08/21/22 Therapeutic Exercise: NuStep L3 8 min Long sit PF stretch 30s x3 3 way ankle RTB 15x2 each direction R SLR 15x2 S/L R abduction 15x2 FAQs R 15x2 w/adduction PHE 15x2 Standing ham curls 15x2 Standing march B 15x2 alt. legs Tandem and retrowalking at counter fingertip support 5 trips  [facility] Adult PT Treatment:                                                DATE: 08/16/22 Therapeutic Exercise: NuStep L2 8 min Long sit PF stretch 30s x3 3 way ankle YTB 15x2 each direction Toe yoga 60s x2 Seated inv/ev over towel, ankle neutral, 15x2                                                                                                                      DATE: 08/09/22 Eval and HEP   PATIENT EDUCATION:  Education details: Discussed eval findings, rehab rationale and POC and patient is in agreement  Person educated: Patient Education method: Explanation Education comprehension: verbalized understanding and needs further education  HOME EXERCISE PROGRAM: Access Code: [email or web address] URL: [email or web address] Date: 08/16/2022 Prepared by: Gustavus Bryant  Exercises - Long Sitting  Plantar Fascia Stretch with Towel  - 3 x daily - 5 x weekly - 1 sets - 3 reps - 30s hold - Towel Scrunches  - 3 x daily - 5 x weekly - 1 sets - 1 reps - 60s hold - Toe Yoga - Alternating Great Toe and Lesser Toe Extension  - 3 x daily - 5 x weekly - 1 sets - 30 reps - Long Sitting Ankle Inversion with Anchored Resistance  - 2 x daily - 5 x weekly - 2 sets - 15 reps - Long Sitting Ankle Eversion with Resistance  - 2 x daily - 5 x weekly - 2 sets - 15 reps - Long Sitting Ankle Dorsiflexion with Anchored Resistance  - 2 x daily - 5 x weekly - 2 sets - 15 reps  ASSESSMENT:  CLINICAL IMPRESSION: Advanced balance and proprioceptive exercises as noted adding cable walks and increasing resistance on all remaining tasks.  No LOB, instability or pain reported.  (  EVAL): Patient is a 22 y.o. male who was seen today for physical therapy evaluation and treatment for R foot reconstruction including plantar fascia release complicated by R calcaneal fx.  He is WBAT in boot with minimal pain. Patient presents with decreased ROM in R DF/PF with strength deficits in all 4 planes.  Assessment focus on PF/DF due to congenital deformities.  He is compliant with WB status and boot wear, pain and swelling are minimal.  Patient is a good candidate for OPPT to regain ROM strength and function in R ankle/foot.   OBJECTIVE IMPAIRMENTS: Abnormal gait, decreased balance, decreased endurance, decreased knowledge of condition, decreased mobility, difficulty walking, decreased ROM, decreased strength, impaired flexibility, improper body mechanics, postural dysfunction, and pain.   ACTIVITY LIMITATIONS: carrying, lifting, standing, squatting, and stairs  PERSONAL FACTORS: Age, Fitness, Past/current experiences, and Time since onset of injury/illness/exacerbation are also affecting patient's functional outcome.   REHAB POTENTIAL: Good  CLINICAL DECISION MAKING: Evolving/moderate complexity  EVALUATION COMPLEXITY:  Low   GOALS: Goals reviewed with patient? No  SHORT TERM GOALS: Target date: 08/30/2022   Patient to demonstrate independence in HEP  Baseline: [id number] Goal status: Met  2.  Patient to negotiate 16 stairs in boot safely with most appropriate pattern. Baseline: TBD; 08/16/22 16 steps with step to pattern and B rail assist Goal status: INITIAL    LONG TERM GOALS: Target date: 09/20/2022    Increase R ankle strength to 4/5 Baseline:   R 08/09/22   Ankle dorsiflexion 3   Ankle plantarflexion 3    Goal status: INITIAL  2.  Increase A/PROM to 8/10d DF Baseline:   R 08/09/22   Ankle dorsiflexion 4/6d   Ankle plantarflexion 30d    Goal status: INITIAL  3.  233ft ambulation w/o CAM boot Baseline: 73ft with CAM boot; 09/13/22 Unlimited w/o boot Goal status: Met  4.  Patient to negotiate 16 steps with most appropriate pattern Baseline: TBD Goal status: INITIAL  5.  Increase FOTO score to 79 Baseline: 54 Goal status: INITIAL     PLAN:  PT FREQUENCY: 1-2x/week  PT DURATION: 8 weeks  PLANNED INTERVENTIONS: Therapeutic exercises, Therapeutic activity, Neuromuscular re-education, Balance training, Gait training, Patient/Family education, Self Care, Joint mobilization, Stair training, DME instructions, Aquatic Therapy, Dry Needling, Electrical stimulation, Cryotherapy, Moist heat, Manual therapy, and Re-evaluation  PLAN FOR NEXT SESSION: HEP review and update, manual techniques as appropriate, aerobic tasks, ROM and flexibility activities, strengthening and PREs, TPDN, gait and balance training as needed     Hildred Laser, PT 09/18/2022, 5:35 PM   Check all possible CPT codes: 16109 - PT Re-evaluation, 97110- Therapeutic Exercise, [phone]- Neuro Re-education, [phone] - Gait Training, [phone] - Manual Therapy, 97530 - Therapeutic Activities, and 97535 - Self Care    Check all conditions that are expected to impact treatment: {Conditions expected to impact  treatment:Musculoskeletal disorders, Contractures, spasticity or fracture relevant to requested treatment, and Structural or anatomic abnormalities   If treatment provided at initial evaluation, no treatment charged due to lack of authorization.

## 2022-09-19 ENCOUNTER — Ambulatory Visit: Payer: 59

## 2022-09-19 ENCOUNTER — Ambulatory Visit (INDEPENDENT_AMBULATORY_CARE_PROVIDER_SITE_OTHER): Payer: Medicaid Other | Admitting: Podiatry

## 2022-09-19 ENCOUNTER — Encounter: Payer: Self-pay | Admitting: Podiatry

## 2022-09-19 ENCOUNTER — Ambulatory Visit (INDEPENDENT_AMBULATORY_CARE_PROVIDER_SITE_OTHER): Payer: 59

## 2022-09-19 DIAGNOSIS — Z9889 Other specified postprocedural states: Secondary | ICD-10-CM | POA: Diagnosis not present

## 2022-09-19 DIAGNOSIS — M79671 Pain in right foot: Secondary | ICD-10-CM | POA: Diagnosis not present

## 2022-09-19 DIAGNOSIS — L97412 Non-pressure chronic ulcer of right heel and midfoot with fat layer exposed: Secondary | ICD-10-CM

## 2022-09-19 NOTE — Progress Notes (Signed)
Subjective:  Patient ID: Justin Duran, male    DOB: 11-05-2000,  MRN: [id number]  Chief Complaint  Patient presents with   Routine Post Op    "It's doing pretty good."    22 y.o. male returns for post-op check.  Doing well he is back in regular shoes has no pain, physical therapy is going well  Review of Systems: Negative except as noted in the HPI. Denies N/V/F/Ch.   Objective:  There were no vitals filed for this visit. There is no height or weight on file to calculate BMI. Constitutional Well developed. Well nourished.  Vascular Foot warm and well perfused. Capillary refill normal to all digits.  Calf is soft and supple, no posterior calf or knee pain, negative Homans' sign  Neurologic Normal speech. Oriented to person, place, and time. Epicritic sensation to light touch grossly present bilaterally.  Dermatologic   Posterior heel ulceration measures 0.5 x 0.3 x 0.3 cm  Orthopedic: No pain to palpation, good range of motion of subtalar and ankle joint     Narrative & Impression  CLINICAL DATA:  Calcaneal fracture   EXAM: CT OF THE RIGHT FOOT WITHOUT CONTRAST   TECHNIQUE: Multidetector CT imaging of the right foot was performed according to the standard protocol. Multiplanar CT image reconstructions were also generated.   RADIATION DOSE REDUCTION: This exam was performed according to the departmental dose-optimization program which includes automated exposure control, adjustment of the mA and/or kV according to patient size and/or use of iterative reconstruction technique.   COMPARISON:  05/23/2022 radiographs and CT from 12/12/2021   FINDINGS: Bones/Joint/Cartilage   Faint residual visibility of portions of the posterior calcaneal fracture, with fracture plane traversed by 2 screws and with the posterior fragment having partially fused in with 1.2 cm of proximal displacement of the posterior fragment. There is some bandlike periosteal reaction or  heterotopic calcification along the posteroinferior calcaneus as shown on image 58 series 7, adjacent to part of the fracture plane.   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 There is cortication along the osteotomy margins.   Bony demineralization noted especially along the cortex in a diffuse manner.   Ligaments   Suboptimally assessed by CT.   Muscles and Tendons   Mild flexor hallucis longus tenosynovitis just proximal to the knot of Henry. Tendinopathy and expansion of the peroneus longus tendon. Attenuated attachment of the tibialis posterior to the navicular, correlate clinically in assessing for tibialis posterior dysfunction.   Distal Achilles tendinopathy. Indistinctness and thickening of the proximal plantar fascia which attaches in the vicinity of the fracture, biomechanical integrity uncertain.   Soft tissues   Subcutaneous edema along the heel, especially posterolaterally and along the posteroinferior heel.   IMPRESSION: 1. Partial union the posterior calcaneal fracture, with fracture plane traversed by 2 screws and with 1.2 cm of partially fused in cephalad displacement of the posterior fragment. 2. Nonunited first metatarsal osteotomy with a dorsal staple type fixator in place. 3. Tendinopathy and expansion of the peroneus longus tendon. 4. Attenuated attachment of the tibialis posterior to the navicular, correlate clinically in assessing for tibialis posterior dysfunction. 5. Distal Achilles tendinopathy. 6. Indistinctness and thickening of the proximal plantar fascia which attaches in the vicinity of the fracture, biomechanical integrity uncertain. 7. Subcutaneous edema along the heel, especially posterolaterally and along the posteroinferior heel.     Electronically Signed   By: Annitta Needs.D.  On: 05/24/2022 10:53     Multiple view plain film radiographs: Complete  consolidation across osteotomy and fracture site of calcaneus, good healing across first metatarsal osteotomy site  Assessment:   1. Status post foot surgery     Plan:  Patient was evaluated and treated and all questions answered.  S/p foot surgery right -Still dealing with some mild residual ulceration, has some maceration he will use collagen powder and discontinue any ointment.  Dress daily.  Continue physical therapy and focus on work hardening.  Hopefully can set a goal to return to work October 1, he is on his work about 10 hours a day for work.  He says he can be on it for about an hour at a time right now before it starts to become painful so he is not ready to go back just yet.  Radiographs taken today show good bony healing  Return in about 4 weeks (around 10/17/2022) for wound care.

## 2022-09-19 NOTE — Therapy (Deleted)
OUTPATIENT PHYSICAL THERAPY TREATMENT   Patient Name: Justin Duran MRN: [id number] DOB:11-09-2000, 22 y.o., male Today's Date: 09/19/2022  END OF SESSION:       No past medical history on file. Past Surgical History:  Procedure Laterality Date   CALCANEAL OSTEOTOMY Right 03/17/2022   Procedure: CALCANEAL OSTEOTOMY;  Surgeon: Edwin Cap, DPM;  Location: [facility];  Service: Podiatry;  Laterality: Right;  Lateral calcaneal displacement osteotomy   CAPSULOTOMY Right 03/17/2022   Procedure: MIDFOOT W/TENDON LENGTHENING & TENDON TRANSFER;  Surgeon: Edwin Cap, DPM;  Location: [facility];  Service: Podiatry;  Laterality: Right;  PT tendon lengthening; peroneus longus to brevis transfer   CARDIAC SURGERY     CIRCUMCISION     HERNIA REPAIR  12/22/1998   Performed at [facility]   METATARSAL OSTEOTOMY Right 03/17/2022   Procedure: BASE WEDGE OSTEOTOMY;  Surgeon: Edwin Cap, DPM;  Location: [facility];  Service: Podiatry;  Laterality: Right;  Dorsiflexion wedge osteotomy   PLANTAR FASCIA RELEASE Right 03/17/2022   Procedure: DIVISION OF PLANTAR FASCIA & MUSCLE;  Surgeon: Edwin Cap, DPM;  Location: [facility];  Service: Podiatry;  Laterality: Right;  Steindler Stripping   Patient Active Problem List   Diagnosis Date Noted   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   Pes cavus of right foot 03/18/2022   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   Plantar flexed metatarsal bone of right foot 03/18/2022   Preventative health care 02/03/2022   History of heart surgery 02/03/2022   Pre-op exam 02/03/2022   Obesity (BMI 30-39.9) 02/03/2022   Marijuana use 02/03/2022   Electronic cigarette use 02/03/2022   Pes cavus, 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  Rationale for Evaluation and Treatment: Rehabilitation  ONSET DATE: 03/17/22 surgery date  SUBJECTIVE:   SUBJECTIVE STATEMENT: Has worn shoes all day w/o previously reported symptoms.  Rates himself at 80%  PERTINENT HISTORY: See PMH  PAIN:  Are you having pain?  Yes: NPRS scale: 3/10 Pain location: R ankle/foot Pain description: ache Aggravating factors: weight bearing Relieving factors: rest  PRECAUTIONS: Other: WBAT in boot  WEIGHT BEARING RESTRICTIONS: Other: WBAT in boot  FALLS:  Has patient fallen in last 6 months? No  OCCUPATION: not working  PLOF: Independent  PATIENT GOALS: To reduce and manage my foot issues  NEXT MD VISIT: 4 weeks  OBJECTIVE:   DIAGNOSTIC FINDINGS: IMPRESSION: 1. Partial union the posterior calcaneal fracture, with fracture plane traversed by 2 screws and with 1.2 cm of partially fused in cephalad displacement of the posterior fragment. 2. Nonunited first metatarsal osteotomy with a dorsal staple type fixator in place. 3. Tendinopathy and expansion of the peroneus longus tendon. 4. Attenuated attachment of the tibialis posterior to the navicular, correlate clinically in assessing for tibialis posterior dysfunction. 5. Distal Achilles tendinopathy. 6. Indistinctness and thickening of the proximal plantar fascia which attaches in the vicinity of the fracture, biomechanical integrity uncertain. 7. Subcutaneous edema along the heel, especially posterolaterally and along the posteroinferior heel.     Electronically Signed   By:  Gaylyn Rong M.D.   On: 05/24/2022 10:53  PATIENT SURVEYS:  FOTO 58(79 predicted)  MUSCLE LENGTH: Hamstrings: Right 60 deg; Left 70 deg  POSTURE:   deferred  PALPATION: deferred  LOWER EXTREMITY ROM:  A/PROM Right eval Left eval  Hip flexion    Hip extension    Hip abduction    Hip adduction    Hip internal rotation    Hip external rotation    Knee flexion    Knee extension    Ankle dorsiflexion 4/6d   Ankle plantarflexion 30d   Ankle inversion    Ankle eversion     (Blank rows = not tested)  LOWER EXTREMITY MMT:  MMT Right eval Left eval  Hip flexion    Hip extension    Hip abduction    Hip adduction    Hip internal rotation    Hip external rotation    Knee flexion    Knee extension    Ankle dorsiflexion 3   Ankle plantarflexion 3   Ankle inversion    Ankle eversion     (Blank rows = not tested)  LOWER EXTREMITY SPECIAL TESTS:  deferred ` FUNCTIONAL TESTS:  5 times sit to stand: 15s arms crossed  GAIT: Distance walked: 87ftx2 Assistive device utilized:  CAM boot Level of assistance: Complete Independence Comments: antalgic gait with decreased R stance time   TODAY'S TREATMENT:      [facility] Adult PT Treatment:                                                DATE: 09/18/22 Therapeutic Exercise: NuStep L5 x 8 min while taking subjective Cable walks 4 way 5 rep each 13 Runners step 8 in 15/15 10# KB Step overs 8in 10/10 Leg press 65# 15x B, 45# unilaterally 15/15  [facility] Adult PT Treatment:                                                DATE: 09/13/22 Therapeutic Exercise: NuStep L4 x 8 min while taking subjective Runners step 6 in 15/15 5# KB Step overs 6 in 10/10 BAPS L1 15/15 in standing starting with A/P and M/L directions Heel raises 4 in 15x Semi Tandem on foam 2x30" EO/EC Leg press 55# 15x B, 35# unilaterally 15/15 10x STS arms crossed      [facility] Adult PT Treatment:                                                DATE: 09/04/22 Therapeutic Exercise: NuStep L4 x 5 min while taking subjective Long sit PF stretch 30s x3 3 way ankle RTB 15x2 each direction BAPS L4 2x10 cw/ccw R Slant board  gastroc stretch 2x30" Heel toe raises 2x15 Tandem on foam 2x30" Tandem walk on foam x 4 laps Step ups 8in 2x10 R leading Step ups lateral 8in 2x10 each Leg press 55# 2x10  [facility] Adult PT Treatment:  DATE: 08/21/22 Therapeutic Exercise: NuStep L3 8 min Long sit PF stretch 30s x3 3 way ankle RTB 15x2 each direction R SLR 15x2 S/L R abduction 15x2 FAQs R 15x2 w/adduction PHE 15x2 Standing ham curls 15x2 Standing march B 15x2 alt. legs Tandem and retrowalking at counter fingertip support 5 trips  [facility] Adult PT Treatment:                                                DATE: 08/16/22 Therapeutic Exercise: NuStep L2 8 min Long sit PF stretch 30s x3 3 way ankle YTB 15x2 each direction Toe yoga 60s x2 Seated inv/ev over towel, ankle neutral, 15x2                                                                                                                      DATE: 08/09/22 Eval and HEP   PATIENT EDUCATION:  Education details: Discussed eval findings, rehab rationale and POC and patient is in agreement  Person educated: Patient Education method: Explanation Education comprehension: verbalized understanding and needs further education  HOME EXERCISE PROGRAM: Access Code: [email or web address] URL: [email or web address] Date: 08/16/2022 Prepared by: Gustavus Bryant  Exercises - Long Sitting Plantar Fascia Stretch with Towel  - 3 x daily - 5 x weekly - 1 sets - 3 reps - 30s hold - Towel Scrunches  - 3 x daily - 5 x weekly - 1 sets - 1 reps - 60s hold - Toe Yoga - Alternating Great Toe and Lesser Toe Extension  - 3 x daily - 5 x weekly - 1 sets - 30 reps - Long Sitting Ankle Inversion with Anchored Resistance  - 2 x daily - 5 x weekly - 2 sets - 15 reps - Long Sitting Ankle Eversion with Resistance  - 2 x daily - 5 x weekly - 2 sets - 15 reps - Long Sitting Ankle Dorsiflexion with Anchored Resistance  - 2 x daily - 5 x weekly - 2 sets -  15 reps  ASSESSMENT:  CLINICAL IMPRESSION: Advanced balance and proprioceptive exercises as noted adding cable walks and increasing resistance on all remaining tasks.  No LOB, instability or pain reported.  (EVAL): Patient is a 22 y.o. male who was seen today for physical therapy evaluation and treatment for R foot reconstruction including plantar fascia release complicated by R calcaneal fx.  He is WBAT in boot with minimal pain. Patient presents with decreased ROM in R DF/PF with strength deficits in all 4 planes.  Assessment focus on PF/DF due to congenital deformities.  He is compliant with WB status and boot wear, pain and swelling are minimal.  Patient is a good candidate for OPPT to regain ROM strength and function in R ankle/foot.   OBJECTIVE IMPAIRMENTS: Abnormal gait, decreased balance, decreased endurance, decreased knowledge of condition, decreased mobility, difficulty walking, decreased ROM, decreased strength, impaired  flexibility, improper body mechanics, postural dysfunction, and pain.   ACTIVITY LIMITATIONS: carrying, lifting, standing, squatting, and stairs  PERSONAL FACTORS: Age, Fitness, Past/current experiences, and Time since onset of injury/illness/exacerbation are also affecting patient's functional outcome.   REHAB POTENTIAL: Good  CLINICAL DECISION MAKING: Evolving/moderate complexity  EVALUATION COMPLEXITY: Low   GOALS: Goals reviewed with patient? No  SHORT TERM GOALS: Target date: 08/30/2022   Patient to demonstrate independence in HEP  Baseline: [email or web address] Goal status: Met  2.  Patient to negotiate 16 stairs in boot safely with most appropriate pattern. Baseline: TBD; 08/16/22 16 steps with step to pattern and B rail assist Goal status: INITIAL    LONG TERM GOALS: Target date: 09/20/2022    Increase R ankle strength to 4/5 Baseline:   R 08/09/22   Ankle dorsiflexion 3   Ankle plantarflexion 3    Goal status: INITIAL  2.  Increase A/PROM to 8/10d  DF Baseline:   R 08/09/22   Ankle dorsiflexion 4/6d   Ankle plantarflexion 30d    Goal status: INITIAL  3.  270ft ambulation w/o CAM boot Baseline: 61ft with CAM boot; 09/13/22 Unlimited w/o boot Goal status: Met  4.  Patient to negotiate 16 steps with most appropriate pattern Baseline: TBD Goal status: INITIAL  5.  Increase FOTO score to 79 Baseline: 54 Goal status: INITIAL     PLAN:  PT FREQUENCY: 1-2x/week  PT DURATION: 8 weeks  PLANNED INTERVENTIONS: Therapeutic exercises, Therapeutic activity, Neuromuscular re-education, Balance training, Gait training, Patient/Family education, Self Care, Joint mobilization, Stair training, DME instructions, Aquatic Therapy, Dry Needling, Electrical stimulation, Cryotherapy, Moist heat, Manual therapy, and Re-evaluation  PLAN FOR NEXT SESSION: HEP review and update, manual techniques as appropriate, aerobic tasks, ROM and flexibility activities, strengthening and PREs, TPDN, gait and balance training as needed     Hildred Laser, PT 09/19/2022, 12:42 PM   Check all possible CPT codes: 69629 - PT Re-evaluation, 97110- Therapeutic Exercise, [phone]- Neuro Re-education, [phone] - Gait Training, [phone] - Manual Therapy, 97530 - Therapeutic Activities, and [phone] - Self Care    Check all conditions that are expected to impact treatment: {Conditions expected to impact treatment:Musculoskeletal disorders, Contractures, spasticity or fracture relevant to requested treatment, and Structural or anatomic abnormalities   If treatment provided at initial evaluation, no treatment charged due to lack of authorization.

## 2022-09-20 ENCOUNTER — Ambulatory Visit: Payer: 59

## 2022-09-21 ENCOUNTER — Ambulatory Visit: Payer: 59 | Admitting: Podiatry

## 2022-09-21 DIAGNOSIS — Z419 Encounter for procedure for purposes other than remedying health state, unspecified: Secondary | ICD-10-CM | POA: Diagnosis not present

## 2022-09-29 NOTE — Therapy (Unsigned)
OUTPATIENT PHYSICAL THERAPY TREATMENT   Patient Name: Justin Duran MRN: [id number] DOB:[birth year], 22 y.o., male Today's Date: 09/29/2022  END OF SESSION:       No past medical history on file. Past Surgical History:  Procedure Laterality Date   CALCANEAL OSTEOTOMY Right 03/17/2022   Procedure: CALCANEAL OSTEOTOMY;  Surgeon: Edwin Cap, DPM;  Location: [facility];  Service: Podiatry;  Laterality: Right;  Lateral calcaneal displacement osteotomy   CAPSULOTOMY Right 03/17/2022   Procedure: MIDFOOT W/TENDON LENGTHENING & TENDON TRANSFER;  Surgeon: Edwin Cap, DPM;  Location: [facility];  Service: Podiatry;  Laterality: Right;  PT tendon lengthening; peroneus longus to brevis transfer   CARDIAC SURGERY     CIRCUMCISION     HERNIA REPAIR  12/22/1998   Performed at [facility]   METATARSAL OSTEOTOMY Right 03/17/2022   Procedure: BASE WEDGE OSTEOTOMY;  Surgeon: Edwin Cap, DPM;  Location: [facility];  Service: Podiatry;  Laterality: Right;  Dorsiflexion wedge osteotomy   PLANTAR FASCIA RELEASE Right 03/17/2022   Procedure: DIVISION OF PLANTAR FASCIA & MUSCLE;  Surgeon: Edwin Cap, DPM;  Location: [facility];  Service: Podiatry;  Laterality: Right;  Steindler Stripping   Patient Active Problem List   Diagnosis Date Noted   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   Pes cavus of right foot 03/18/2022   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   Plantar flexed metatarsal bone of right foot 03/18/2022   Preventative health care 02/03/2022   History of heart surgery 02/03/2022   Pre-op exam 02/03/2022   Obesity (BMI 30-39.9) 02/03/2022   Marijuana use 02/03/2022   Electronic cigarette use 02/03/2022   Pes cavus, 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  Rationale for Evaluation and Treatment: Rehabilitation  ONSET DATE: 03/17/22 surgery date  SUBJECTIVE:   SUBJECTIVE STATEMENT: Has worn shoes all day w/o previously reported symptoms.  Rates himself at 80%  PERTINENT HISTORY: See PMH  PAIN:  Are you having pain?  Yes: NPRS scale: 3/10 Pain location: R ankle/foot Pain description: ache Aggravating factors: weight bearing Relieving factors: rest  PRECAUTIONS: Other: WBAT in boot  WEIGHT BEARING RESTRICTIONS: Other: WBAT in boot  FALLS:  Has patient fallen in last 6 months? No  OCCUPATION: not working  PLOF: Independent  PATIENT GOALS: To reduce and manage my foot issues  NEXT MD VISIT: 4 weeks  OBJECTIVE:   DIAGNOSTIC FINDINGS: IMPRESSION: 1. Partial union the posterior calcaneal fracture, with fracture plane traversed by 2 screws and with 1.2 cm of partially fused in cephalad displacement of the posterior fragment. 2. Nonunited first metatarsal osteotomy with a dorsal staple type fixator in place. 3. Tendinopathy and expansion of the peroneus longus tendon. 4. Attenuated attachment of the tibialis posterior to the navicular, correlate clinically in assessing for tibialis posterior dysfunction. 5. Distal Achilles tendinopathy. 6. Indistinctness and thickening of the proximal plantar fascia which attaches in the vicinity of the fracture, biomechanical integrity uncertain. 7. Subcutaneous edema along the heel, especially posterolaterally and along the posteroinferior heel.     Electronically Signed   By:  Gaylyn Rong M.D.   On: 05/24/2022 10:53  PATIENT SURVEYS:  FOTO 58(79 predicted)  MUSCLE LENGTH: Hamstrings: Right 60 deg; Left 70 deg  POSTURE:   deferred  PALPATION: deferred  LOWER EXTREMITY ROM:  A/PROM Right eval Left eval  Hip flexion    Hip extension    Hip abduction    Hip adduction    Hip internal rotation    Hip external rotation    Knee flexion    Knee extension    Ankle dorsiflexion 4/6d   Ankle plantarflexion 30d   Ankle inversion    Ankle eversion     (Blank rows = not tested)  LOWER EXTREMITY MMT:  MMT Right eval Left eval  Hip flexion    Hip extension    Hip abduction    Hip adduction    Hip internal rotation    Hip external rotation    Knee flexion    Knee extension    Ankle dorsiflexion 3   Ankle plantarflexion 3   Ankle inversion    Ankle eversion     (Blank rows = not tested)  LOWER EXTREMITY SPECIAL TESTS:  deferred ` FUNCTIONAL TESTS:  5 times sit to stand: 15s arms crossed  GAIT: Distance walked: 66ftx2 Assistive device utilized:  CAM boot Level of assistance: Complete Independence Comments: antalgic gait with decreased R stance time   TODAY'S TREATMENT:      [facility] Adult PT Treatment:                                                DATE: 09/18/22 Therapeutic Exercise: NuStep L5 x 8 min while taking subjective Cable walks 4 way 5 rep each 13 Runners step 8 in 15/15 10# KB Step overs 8in 10/10 Leg press 65# 15x B, 45# unilaterally 15/15  [facility] Adult PT Treatment:                                                DATE: 09/13/22 Therapeutic Exercise: NuStep L4 x 8 min while taking subjective Runners step 6 in 15/15 5# KB Step overs 6 in 10/10 BAPS L1 15/15 in standing starting with A/P and M/L directions Heel raises 4 in 15x Semi Tandem on foam 2x30" EO/EC Leg press 55# 15x B, 35# unilaterally 15/15 10x STS arms crossed      [facility] Adult PT Treatment:                                                DATE: 09/04/22 Therapeutic Exercise: NuStep L4 x 5 min while taking subjective Long sit PF stretch 30s x3 3 way ankle RTB 15x2 each direction BAPS L4 2x10 cw/ccw R Slant board  gastroc stretch 2x30" Heel toe raises 2x15 Tandem on foam 2x30" Tandem walk on foam x 4 laps Step ups 8in 2x10 R leading Step ups lateral 8in 2x10 each Leg press 55# 2x10  [facility] Adult PT Treatment:  DATE: 08/21/22 Therapeutic Exercise: NuStep L3 8 min Long sit PF stretch 30s x3 3 way ankle RTB 15x2 each direction R SLR 15x2 S/L R abduction 15x2 FAQs R 15x2 w/adduction PHE 15x2 Standing ham curls 15x2 Standing march B 15x2 alt. legs Tandem and retrowalking at counter fingertip support 5 trips  [facility] Adult PT Treatment:                                                DATE: 08/16/22 Therapeutic Exercise: NuStep L2 8 min Long sit PF stretch 30s x3 3 way ankle YTB 15x2 each direction Toe yoga 60s x2 Seated inv/ev over towel, ankle neutral, 15x2                                                                                                                      DATE: 08/09/22 Eval and HEP   PATIENT EDUCATION:  Education details: Discussed eval findings, rehab rationale and POC and patient is in agreement  Person educated: Patient Education method: Explanation Education comprehension: verbalized understanding and needs further education  HOME EXERCISE PROGRAM: Access Code: [email or web address] URL: [email or web address] Date: 08/16/2022 Prepared by: Gustavus Bryant  Exercises - Long Sitting Plantar Fascia Stretch with Towel  - 3 x daily - 5 x weekly - 1 sets - 3 reps - 30s hold - Towel Scrunches  - 3 x daily - 5 x weekly - 1 sets - 1 reps - 60s hold - Toe Yoga - Alternating Great Toe and Lesser Toe Extension  - 3 x daily - 5 x weekly - 1 sets - 30 reps - Long Sitting Ankle Inversion with Anchored Resistance  - 2 x daily - 5 x weekly - 2 sets - 15 reps - Long Sitting Ankle Eversion with Resistance  - 2 x daily - 5 x weekly - 2 sets - 15 reps - Long Sitting Ankle Dorsiflexion with Anchored Resistance  - 2 x daily - 5 x weekly - 2 sets -  15 reps  ASSESSMENT:  CLINICAL IMPRESSION: Advanced balance and proprioceptive exercises as noted adding cable walks and increasing resistance on all remaining tasks.  No LOB, instability or pain reported.  (EVAL): Patient is a 22 y.o. male who was seen today for physical therapy evaluation and treatment for R foot reconstruction including plantar fascia release complicated by R calcaneal fx.  He is WBAT in boot with minimal pain. Patient presents with decreased ROM in R DF/PF with strength deficits in all 4 planes.  Assessment focus on PF/DF due to congenital deformities.  He is compliant with WB status and boot wear, pain and swelling are minimal.  Patient is a good candidate for OPPT to regain ROM strength and function in R ankle/foot.   OBJECTIVE IMPAIRMENTS: Abnormal gait, decreased balance, decreased endurance, decreased knowledge of condition, decreased mobility, difficulty walking, decreased ROM, decreased strength, impaired  flexibility, improper body mechanics, postural dysfunction, and pain.   ACTIVITY LIMITATIONS: carrying, lifting, standing, squatting, and stairs  PERSONAL FACTORS: Age, Fitness, Past/current experiences, and Time since onset of injury/illness/exacerbation are also affecting patient's functional outcome.   REHAB POTENTIAL: Good  CLINICAL DECISION MAKING: Evolving/moderate complexity  EVALUATION COMPLEXITY: Low   GOALS: Goals reviewed with patient? No  SHORT TERM GOALS: Target date: 08/30/2022   Patient to demonstrate independence in HEP  Baseline: [id number] Goal status: Met  2.  Patient to negotiate 16 stairs in boot safely with most appropriate pattern. Baseline: TBD; 08/16/22 16 steps with step to pattern and B rail assist Goal status: INITIAL    LONG TERM GOALS: Target date: 09/20/2022    Increase R ankle strength to 4/5 Baseline:   R 08/09/22   Ankle dorsiflexion 3   Ankle plantarflexion 3    Goal status: INITIAL  2.  Increase A/PROM to 8/10d  DF Baseline:   R 08/09/22   Ankle dorsiflexion 4/6d   Ankle plantarflexion 30d    Goal status: INITIAL  3.  218ft ambulation w/o CAM boot Baseline: 21ft with CAM boot; 09/13/22 Unlimited w/o boot Goal status: Met  4.  Patient to negotiate 16 steps with most appropriate pattern Baseline: TBD Goal status: INITIAL  5.  Increase FOTO score to 79 Baseline: 54 Goal status: INITIAL     PLAN:  PT FREQUENCY: 1-2x/week  PT DURATION: 8 weeks  PLANNED INTERVENTIONS: Therapeutic exercises, Therapeutic activity, Neuromuscular re-education, Balance training, Gait training, Patient/Family education, Self Care, Joint mobilization, Stair training, DME instructions, Aquatic Therapy, Dry Needling, Electrical stimulation, Cryotherapy, Moist heat, Manual therapy, and Re-evaluation  PLAN FOR NEXT SESSION: HEP review and update, manual techniques as appropriate, aerobic tasks, ROM and flexibility activities, strengthening and PREs, TPDN, gait and balance training as needed     Hildred Laser, PT 09/29/2022, 8:08 AM   Check all possible CPT codes: 40981 - PT Re-evaluation, 97110- Therapeutic Exercise, [phone]- Neuro Re-education, [phone] - Gait Training, [phone] - Manual Therapy, 97530 - Therapeutic Activities, and 97535 - Self Care    Check all conditions that are expected to impact treatment: {Conditions expected to impact treatment:Musculoskeletal disorders, Contractures, spasticity or fracture relevant to requested treatment, and Structural or anatomic abnormalities   If treatment provided at initial evaluation, no treatment charged due to lack of authorization.

## 2022-10-02 ENCOUNTER — Ambulatory Visit: Payer: 59 | Attending: Podiatry

## 2022-10-02 DIAGNOSIS — S92001D Unspecified fracture of right calcaneus, subsequent encounter for fracture with routine healing: Secondary | ICD-10-CM | POA: Insufficient documentation

## 2022-10-02 DIAGNOSIS — M6281 Muscle weakness (generalized): Secondary | ICD-10-CM | POA: Diagnosis present

## 2022-10-02 DIAGNOSIS — R2689 Other abnormalities of gait and mobility: Secondary | ICD-10-CM | POA: Insufficient documentation

## 2022-10-10 NOTE — Therapy (Addendum)
OUTPATIENT PHYSICAL THERAPY TREATMENT/DC SUMMARY   Patient Name: Justin Duran MRN: [id number] DOB:[birth year], 22 y.o., male Today's Date: 10/12/2022 PHYSICAL THERAPY DISCHARGE SUMMARY  Visits from Start of Care: 8  Current functional level related to goals / functional outcomes: Goals met   Remaining deficits: Minor discomfort   Education / Equipment: HEP   Patient agrees to discharge. Patient goals were met. Patient is being discharged due to being pleased with the current functional level.  END OF SESSION:  PT End of Session - 10/12/22 1703     Visit Number 8    Number of Visits 12    Date for PT Re-Evaluation 10/04/22    Authorization Type [employer]    PT Start Time 1700    PT Stop Time 1740    PT Time Calculation (min) 40 min    Activity Tolerance Patient tolerated treatment well    Behavior During Therapy WFL for tasks assessed/performed                  History reviewed. No pertinent past medical history. Past Surgical History:  Procedure Laterality Date   CALCANEAL OSTEOTOMY Right 03/17/2022   Procedure: CALCANEAL OSTEOTOMY;  Surgeon: Edwin Cap, DPM;  Location: [facility];  Service: Podiatry;  Laterality: Right;  Lateral calcaneal displacement osteotomy   CAPSULOTOMY Right 03/17/2022   Procedure: MIDFOOT W/TENDON LENGTHENING & TENDON TRANSFER;  Surgeon: Edwin Cap, DPM;  Location: [facility];  Service: Podiatry;  Laterality: Right;  PT tendon lengthening; peroneus longus to brevis transfer   CARDIAC SURGERY     CIRCUMCISION     HERNIA REPAIR  12/22/1998   Performed at [facility]   METATARSAL OSTEOTOMY Right 03/17/2022   Procedure: BASE WEDGE OSTEOTOMY;  Surgeon: Edwin Cap, DPM;  Location: [facility];  Service: Podiatry;  Laterality: Right;  Dorsiflexion wedge osteotomy   PLANTAR FASCIA RELEASE Right 03/17/2022   Procedure: DIVISION OF PLANTAR FASCIA & MUSCLE;  Surgeon: Edwin Cap, DPM;  Location: [facility];  Service: Podiatry;   Laterality: Right;  Steindler Stripping   Patient Active Problem List   Diagnosis Date Noted   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   Pes cavus of right foot 03/18/2022   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   Plantar flexed metatarsal bone of right foot 03/18/2022   Preventative health care 02/03/2022   History of heart surgery 02/03/2022   Pre-op exam 02/03/2022   Obesity (BMI 30-39.9) 02/03/2022   Marijuana use 02/03/2022   Electronic cigarette use 02/03/2022   Pes cavus, 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   Pain in joint, lower leg 12/22/2013   Hallux hammertoe 12/22/2013    PCP: Eden Emms, NP   REFERRING PROVIDER: Edwin Cap, DPM  REFERRING DIAG: [phone] (ICD-10-CM) - Other closed displaced fracture of tuberosity of left calcaneus, initial encounter M21.6X1 (ICD-10-CM) - Acquired cavovarus deformity of foot, right M67.88 (ICD-10-CM) - Peroneal tendinosis, right M24.571 (ICD-10-CM) - Equinus contracture of right ankle  THERAPY DIAG:  Muscle weakness (generalized)  Closed nondisplaced fracture of right calcaneus with routine healing, unspecified portion of calcaneus, subsequent encounter  Other abnormalities of gait and mobility  Rationale for Evaluation and Treatment: Rehabilitation  ONSET DATE: 03/17/22 surgery date  SUBJECTIVE:   SUBJECTIVE STATEMENT: Denies pain but notes occasional discomfort in heel near fx site.  Not enough to limit activity.  Apprehension cited as main limitation.  PERTINENT HISTORY: See PMH  PAIN:  Are you having pain?  Yes: NPRS scale: 1/10 Pain location:  R ankle/foot Pain description: ache Aggravating factors: weight bearing Relieving factors: rest  PRECAUTIONS: Other: WBAT in boot  WEIGHT BEARING RESTRICTIONS: Other: WBAT in boot  FALLS:  Has patient fallen in last 6 months? No  OCCUPATION: not working  PLOF: Independent  PATIENT GOALS: To reduce and manage my foot issues  NEXT MD VISIT: 4 weeks  OBJECTIVE:    DIAGNOSTIC FINDINGS: IMPRESSION: 1. Partial union the posterior calcaneal fracture, with fracture plane traversed by 2 screws and with 1.2 cm of partially fused in cephalad displacement of the posterior fragment. 2. Nonunited first metatarsal osteotomy with a dorsal staple type fixator in place. 3. Tendinopathy and expansion of the peroneus longus tendon. 4. Attenuated attachment of the tibialis posterior to the navicular, correlate clinically in assessing for tibialis posterior dysfunction. 5. Distal Achilles tendinopathy. 6. Indistinctness and thickening of the proximal plantar fascia which attaches in the vicinity of the fracture, biomechanical integrity uncertain. 7. Subcutaneous edema along the heel, especially posterolaterally and along the posteroinferior heel.     Electronically Signed   By: Gaylyn Rong M.D.   On: 05/24/2022 10:53  PATIENT SURVEYS:  FOTO 58(79 predicted) ;10/02/22 67  MUSCLE LENGTH: Hamstrings: Right 60 deg; Left 70 deg  POSTURE:  deferred  PALPATION: deferred  LOWER EXTREMITY ROM:  A/PROM Right eval Left eval R 10/02/22  Hip flexion     Hip extension     Hip abduction     Hip adduction     Hip internal rotation     Hip external rotation     Knee flexion     Knee extension     Ankle dorsiflexion 4/6d  10/12d  Ankle plantarflexion 30d  30  Ankle inversion   35  Ankle eversion   25   (Blank rows = not tested)  LOWER EXTREMITY MMT:  MMT Right eval Left eval R 10/02/22  Hip flexion     Hip extension     Hip abduction     Hip adduction     Hip internal rotation     Hip external rotation     Knee flexion     Knee extension     Ankle dorsiflexion 3  4  Ankle plantarflexion 3  4  Ankle inversion     Ankle eversion      (Blank rows = not tested)  LOWER EXTREMITY SPECIAL TESTS:  deferred ` FUNCTIONAL TESTS:  5 times sit to stand: 15s arms crossed  GAIT: Distance walked: 36ftx2 Assistive device utilized:  CAM  boot Level of assistance: Complete Independence Comments: antalgic gait with decreased R stance time   TODAY'S TREATMENT:      [facility] Adult PT Treatment:                                                DATE: 10/12/22 Therapeutic Exercise: NuStep L7 x 8 min  Cable walks 4 way 5 rep each 17# Runners step 8 in 15/15 15# KB Step overs 8 in 10/10 15# KB Leg press 75# 15x B, 50# unilaterally 15/15 Squatting to facilitate proper mechanics 15x   [facility] Adult PT Treatment:  DATE: 10/02/22 Therapeutic Exercise: Nustep L6 8 min SLS 10s B SL heel raises 10/10 Re-assessment  [facility] Adult PT Treatment:                                                DATE: 09/18/22 Therapeutic Exercise: NuStep L5 x 8 min while taking subjective Cable walks 4 way 5 rep each 13 Runners step 8 in 15/15 10# KB Step overs 8in 10/10 Leg press 65# 15x B, 45# unilaterally 15/15                                                                                                                      DATE: 08/09/22 Eval and HEP   PATIENT EDUCATION:  Education details: Discussed eval findings, rehab rationale and POC and patient is in agreement  Person educated: Patient Education method: Explanation Education comprehension: verbalized understanding and needs further education  HOME EXERCISE PROGRAM: Access Code: [email or web address] URL: [email or web address] Date: 10/02/2022 Prepared by: Gustavus Bryant  Exercises - Single Leg Stance  - 2 x daily - 5 x weekly - 1 sets - 3 reps - 10s hold - Standing Eccentric Heel Raise  - 2 x daily - 5 x weekly - 2 sets - 15 reps - Sit to Stand Without Arm Support  - 2 x daily - 5 x weekly - 1 sets - 10 reps  ASSESSMENT:  CLINICAL IMPRESSION: Continues to function well outside of clinic.  Advanced weight, resistance and difficulty as noted to challenge strength and balance.  Able to perform more difficult tasks w/o any setback.  Limited  ability to maintain lumbar extension with squat.  (EVAL): Patient is a 22 y.o. male who was seen today for physical therapy evaluation and treatment for R foot reconstruction including plantar fascia release complicated by R calcaneal fx.  He is WBAT in boot with minimal pain. Patient presents with decreased ROM in R DF/PF with strength deficits in all 4 planes.  Assessment focus on PF/DF due to congenital deformities.  He is compliant with WB status and boot wear, pain and swelling are minimal.  Patient is a good candidate for OPPT to regain ROM strength and function in R ankle/foot.   OBJECTIVE IMPAIRMENTS: Abnormal gait, decreased balance, decreased endurance, decreased knowledge of condition, decreased mobility, difficulty walking, decreased ROM, decreased strength, impaired flexibility, improper body mechanics, postural dysfunction, and pain.   ACTIVITY LIMITATIONS: carrying, lifting, standing, squatting, and stairs  PERSONAL FACTORS: Age, Fitness, Past/current experiences, and Time since onset of injury/illness/exacerbation are also affecting patient's functional outcome.   REHAB POTENTIAL: Good  CLINICAL DECISION MAKING: Evolving/moderate complexity  EVALUATION COMPLEXITY: Low   GOALS: Goals reviewed with patient? No  SHORT TERM GOALS: Target date: 08/30/2022   Patient to demonstrate independence in HEP  Baseline: [id number] Goal status: Met  2.  Patient to negotiate 16 stairs in boot safely  with most appropriate pattern. Baseline: TBD; 08/16/22 16 steps with step to pattern and B rail assist; 10/02/22 Step through pattern w/o rails Goal status: Met    LONG TERM GOALS: Target date: 10/21/2022    Increase R ankle strength to 4/5 Baseline:   R 08/09/22 R 10/02/22  Ankle dorsiflexion 3 4  Ankle plantarflexion 3            4-      Goal status: Ongoing  2.  Increase A/PROM to 8/10d DF Baseline:   R 08/09/22 R 10/02/22  Ankle dorsiflexion 4/6d 10/12d  Ankle plantarflexion  30d         30d    Goal status: Met  3.  230ft ambulation w/o CAM boot Baseline: 30ft with CAM boot; 09/13/22 Unlimited w/o boot Goal status: Met  4.  Patient to negotiate 16 steps with most appropriate pattern Baseline: TBD; 10/02/22 16 steps with step through pattern no rails  Goal status: Met  5.  Increase FOTO score to 79 Baseline: 54; 67 Goal status: Progressing     PLAN:  PT FREQUENCY: 1EOW  PT DURATION: 4 weeks  PLANNED INTERVENTIONS: Therapeutic exercises, Therapeutic activity, Neuromuscular re-education, Balance training, Gait training, Patient/Family education, Self Care, Joint mobilization, Stair training, DME instructions, Aquatic Therapy, Dry Needling, Electrical stimulation, Cryotherapy, Moist heat, Manual therapy, and Re-evaluation  PLAN FOR NEXT SESSION: HEP review and update, manual techniques as appropriate, aerobic tasks, ROM and flexibility activities, strengthening and PREs, TPDN, gait and balance training as needed     Hildred Laser, PT 10/12/2022, 5:03 PM   Check all possible CPT codes: 78295 - PT Re-evaluation, 97110- Therapeutic Exercise, [phone]- Neuro Re-education, [phone] - Gait Training, [phone] - Manual Therapy, [phone] - Therapeutic Activities, and [phone] - Self Care    Check all conditions that are expected to impact treatment: {Conditions expected to impact treatment:Musculoskeletal disorders, Contractures, spasticity or fracture relevant to requested treatment, and Structural or anatomic abnormalities   If treatment provided at initial evaluation, no treatment charged due to lack of authorization.

## 2022-10-12 ENCOUNTER — Ambulatory Visit: Payer: 59

## 2022-10-12 DIAGNOSIS — R2689 Other abnormalities of gait and mobility: Secondary | ICD-10-CM

## 2022-10-12 DIAGNOSIS — S92001D Unspecified fracture of right calcaneus, subsequent encounter for fracture with routine healing: Secondary | ICD-10-CM

## 2022-10-12 DIAGNOSIS — M6281 Muscle weakness (generalized): Secondary | ICD-10-CM

## 2022-10-17 ENCOUNTER — Ambulatory Visit (INDEPENDENT_AMBULATORY_CARE_PROVIDER_SITE_OTHER): Payer: 59 | Admitting: Podiatry

## 2022-10-17 DIAGNOSIS — L97412 Non-pressure chronic ulcer of right heel and midfoot with fat layer exposed: Secondary | ICD-10-CM | POA: Diagnosis not present

## 2022-10-17 DIAGNOSIS — Z9889 Other specified postprocedural states: Secondary | ICD-10-CM

## 2022-10-18 ENCOUNTER — Encounter: Payer: Self-pay | Admitting: Podiatry

## 2022-10-18 LAB — UNLABELED: Test Ordered On Req: 18881

## 2022-10-18 NOTE — Progress Notes (Signed)
Subjective:  Patient ID: Justin Duran, male    DOB: April 06, 2000,  MRN: [id number]  No chief complaint on file.   22 y.o. male returns for post-op check.  he notes that there is still some drainage from the heel  Review of Systems: Negative except as noted in the HPI. Denies N/V/F/Ch.   Objective:  There were no vitals filed for this visit. There is no height or weight on file to calculate BMI. Constitutional Well developed. Well nourished.  Vascular Foot warm and well perfused. Capillary refill normal to all digits.  Calf is soft and supple, no posterior calf or knee pain, negative Homans' sign  Neurologic Normal speech. Oriented to person, place, and time. Epicritic sensation to light touch grossly present bilaterally.  Dermatologic   Posterior heel ulceration measures 1.5 x 0.5 x 0.6 cm, some serous drainage, no erythema or cellulitis  Orthopedic: No pain to palpation, good range of motion of subtalar and ankle joint     Narrative & Impression  CLINICAL DATA:  Calcaneal fracture   EXAM: CT OF THE RIGHT FOOT WITHOUT CONTRAST   TECHNIQUE: Multidetector CT imaging of the right foot was performed according to the standard protocol. Multiplanar CT image reconstructions were also generated.   RADIATION DOSE REDUCTION: This exam was performed according to the departmental dose-optimization program which includes automated exposure control, adjustment of the mA and/or kV according to patient size and/or use of iterative reconstruction technique.   COMPARISON:  05/23/2022 radiographs and CT from 12/12/2021   FINDINGS: Bones/Joint/Cartilage   Faint residual visibility of portions of the posterior calcaneal fracture, with fracture plane traversed by 2 screws and with the posterior fragment having partially fused in with 1.2 cm of proximal displacement of the posterior fragment. There is some bandlike periosteal reaction or heterotopic calcification along  the posteroinferior calcaneus as shown on image 58 series 7, adjacent to part of the fracture plane.   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 There is cortication along the osteotomy margins.   Bony demineralization noted especially along the cortex in a diffuse manner.   Ligaments   Suboptimally assessed by CT.   Muscles and Tendons   Mild flexor hallucis longus tenosynovitis just proximal to the knot of Henry. Tendinopathy and expansion of the peroneus longus tendon. Attenuated attachment of the tibialis posterior to the navicular, correlate clinically in assessing for tibialis posterior dysfunction.   Distal Achilles tendinopathy. Indistinctness and thickening of the proximal plantar fascia which attaches in the vicinity of the fracture, biomechanical integrity uncertain.   Soft tissues   Subcutaneous edema along the heel, especially posterolaterally and along the posteroinferior heel.   IMPRESSION: 1. Partial union the posterior calcaneal fracture, with fracture plane traversed by 2 screws and with 1.2 cm of partially fused in cephalad displacement of the posterior fragment. 2. Nonunited first metatarsal osteotomy with a dorsal staple type fixator in place. 3. Tendinopathy and expansion of the peroneus longus tendon. 4. Attenuated attachment of the tibialis posterior to the navicular, correlate clinically in assessing for tibialis posterior dysfunction. 5. Distal Achilles tendinopathy. 6. Indistinctness and thickening of the proximal plantar fascia which attaches in the vicinity of the fracture, biomechanical integrity uncertain. 7. Subcutaneous edema along the heel, especially posterolaterally and along the posteroinferior heel.     Electronically Signed   By: Gaylyn Rong M.D.   On: 05/24/2022 10:53     Multiple view plain film radiographs:  Complete consolidation across osteotomy  and fracture site of calcaneus, good healing across first metatarsal osteotomy site  Assessment:   1. Ulcer of right heel and midfoot with fat layer exposed (HCC)     Plan:  Patient was evaluated and treated and all questions answered.  S/p foot surgery right -Ulceration still present.  I debrided the ulcer today of nonviable tissue utilizing a sharp scalpel to the subcutaneous layer in an excisional manner.  There was some new serous drainage deep to the ulceration and I cultured this.  Will place him on antibiotics pending results of the culture data.  We also discussed that excision of the ulcer and removal of the screws may be indicated as well.  At next visit we will take new x-rays to reassess.  Return in about 3 weeks (around 11/07/2022) for wound care, new xrays.

## 2022-10-22 DIAGNOSIS — Z419 Encounter for procedure for purposes other than remedying health state, unspecified: Secondary | ICD-10-CM | POA: Diagnosis not present

## 2022-10-29 NOTE — Therapy (Deleted)
OUTPATIENT PHYSICAL THERAPY TREATMENT   Patient Name: Justin Duran MRN: [id number] DOB:2000-09-22, 22 y.o., male Today's Date: 10/29/2022  END OF SESSION:         No past medical history on file. Past Surgical History:  Procedure Laterality Date   CALCANEAL OSTEOTOMY Right 03/17/2022   Procedure: CALCANEAL OSTEOTOMY;  Surgeon: Edwin Cap, DPM;  Location: [facility];  Service: Podiatry;  Laterality: Right;  Lateral calcaneal displacement osteotomy   CAPSULOTOMY Right 03/17/2022   Procedure: MIDFOOT W/TENDON LENGTHENING & TENDON TRANSFER;  Surgeon: Edwin Cap, DPM;  Location: [facility];  Service: Podiatry;  Laterality: Right;  PT tendon lengthening; peroneus longus to brevis transfer   CARDIAC SURGERY     CIRCUMCISION     HERNIA REPAIR  12/22/1998   Performed at [facility]   METATARSAL OSTEOTOMY Right 03/17/2022   Procedure: BASE WEDGE OSTEOTOMY;  Surgeon: Edwin Cap, DPM;  Location: [facility];  Service: Podiatry;  Laterality: Right;  Dorsiflexion wedge osteotomy   PLANTAR FASCIA RELEASE Right 03/17/2022   Procedure: DIVISION OF PLANTAR FASCIA & MUSCLE;  Surgeon: Edwin Cap, DPM;  Location: [facility];  Service: Podiatry;  Laterality: Right;  Steindler Stripping   Patient Active Problem List   Diagnosis Date Noted   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   Pes cavus of right foot 03/18/2022   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   Plantar flexed metatarsal bone of right foot 03/18/2022   Preventative health care 02/03/2022   History of heart surgery 02/03/2022   Pre-op exam 02/03/2022   Obesity (BMI 30-39.9) 02/03/2022   Marijuana use 02/03/2022   Electronic cigarette use 02/03/2022   Pes cavus, 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  Rationale for Evaluation and Treatment: Rehabilitation  ONSET DATE: 03/17/22 surgery date  SUBJECTIVE:   SUBJECTIVE STATEMENT: Denies pain but notes occasional discomfort in heel near fx site.  Not enough to limit activity.  Apprehension cited as main limitation.  PERTINENT HISTORY: See PMH  PAIN:  Are you having pain?  Yes: NPRS scale: 1/10 Pain location: R ankle/foot Pain description: ache Aggravating factors: weight bearing Relieving factors: rest  PRECAUTIONS: Other: WBAT in boot  WEIGHT BEARING RESTRICTIONS: Other: WBAT in boot  FALLS:  Has patient fallen in last 6 months? No  OCCUPATION: not working  PLOF: Independent  PATIENT GOALS: To reduce and manage my foot issues  NEXT MD VISIT: 4 weeks  OBJECTIVE:   DIAGNOSTIC FINDINGS: IMPRESSION: 1. Partial union the posterior calcaneal fracture, with fracture plane traversed by 2 screws and with 1.2 cm of partially fused in cephalad displacement of the posterior fragment. 2. Nonunited first metatarsal osteotomy with a dorsal staple type fixator in place. 3. Tendinopathy and expansion of the peroneus longus tendon. 4. Attenuated attachment of the tibialis posterior to the navicular, correlate clinically in assessing for tibialis posterior dysfunction. 5. Distal Achilles tendinopathy. 6. Indistinctness and thickening of the proximal plantar fascia which attaches in the vicinity of the fracture, biomechanical integrity uncertain. 7. Subcutaneous edema along the heel, especially posterolaterally and along the  posteroinferior heel.     Electronically Signed   By: Gaylyn Rong M.D.   On: 05/24/2022 10:53  PATIENT SURVEYS:  FOTO 58(79 predicted) ;10/02/22 67  MUSCLE  LENGTH: Hamstrings: Right 60 deg; Left 70 deg  POSTURE:  deferred  PALPATION: deferred  LOWER EXTREMITY ROM:  A/PROM Right eval Left eval R 10/02/22  Hip flexion     Hip extension     Hip abduction     Hip adduction     Hip internal rotation     Hip external rotation     Knee flexion     Knee extension     Ankle dorsiflexion 4/6d  10/12d  Ankle plantarflexion 30d  30  Ankle inversion   35  Ankle eversion   25   (Blank rows = not tested)  LOWER EXTREMITY MMT:  MMT Right eval Left eval R 10/02/22  Hip flexion     Hip extension     Hip abduction     Hip adduction     Hip internal rotation     Hip external rotation     Knee flexion     Knee extension     Ankle dorsiflexion 3  4  Ankle plantarflexion 3  4  Ankle inversion     Ankle eversion      (Blank rows = not tested)  LOWER EXTREMITY SPECIAL TESTS:  deferred ` FUNCTIONAL TESTS:  5 times sit to stand: 15s arms crossed  GAIT: Distance walked: 57ftx2 Assistive device utilized:  CAM boot Level of assistance: Complete Independence Comments: antalgic gait with decreased R stance time   TODAY'S TREATMENT:      [facility] Adult PT Treatment:                                                DATE: 10/12/22 Therapeutic Exercise: NuStep L7 x 8 min  Cable walks 4 way 5 rep each 17# Runners step 8 in 15/15 15# KB Step overs 8 in 10/10 15# KB Leg press 75# 15x B, 50# unilaterally 15/15 Squatting to facilitate proper mechanics 15x   [facility] Adult PT Treatment:                                                DATE: 10/02/22 Therapeutic Exercise: Nustep L6 8 min SLS 10s B SL heel raises 10/10 Re-assessment  [facility] Adult PT Treatment:                                                DATE: 09/18/22 Therapeutic Exercise: NuStep L5 x 8 min while taking subjective Cable walks 4 way 5 rep each 13 Runners step 8 in 15/15 10# KB Step overs 8in 10/10 Leg press 65# 15x B, 45# unilaterally 15/15  DATE: 08/09/22 Eval and HEP   PATIENT EDUCATION:  Education details: Discussed eval findings, rehab rationale and POC and patient is in agreement  Person educated: Patient Education method: Explanation Education comprehension: verbalized understanding and needs further education  HOME EXERCISE PROGRAM: Access Code: [email or web address] URL: [email or web address] Date: 10/02/2022 Prepared by: Gustavus Bryant  Exercises - Single Leg Stance  - 2 x daily - 5 x weekly - 1 sets - 3 reps - 10s hold - Standing Eccentric Heel Raise  - 2 x daily - 5 x weekly - 2 sets - 15 reps - Sit to Stand Without Arm Support  - 2 x daily - 5 x weekly - 1 sets - 10 reps  ASSESSMENT:  CLINICAL IMPRESSION: Continues to function well outside of clinic.  Advanced weight, resistance and difficulty as noted to challenge strength and balance.  Able to perform more difficult tasks w/o any setback.  Limited ability to maintain lumbar extension with squat.  (EVAL): Patient is a 22 y.o. male who was seen today for physical therapy evaluation and treatment for R foot reconstruction including plantar fascia release complicated by R calcaneal fx.  He is WBAT in boot with minimal pain. Patient presents with decreased ROM in R DF/PF with strength deficits in all 4 planes.  Assessment focus on PF/DF due to congenital deformities.  He is compliant with WB status and boot wear, pain and swelling are minimal.  Patient is a good candidate for OPPT to regain ROM strength and function in R ankle/foot.   OBJECTIVE IMPAIRMENTS: Abnormal gait, decreased balance, decreased endurance, decreased knowledge of condition, decreased mobility, difficulty walking, decreased ROM, decreased strength, impaired flexibility, improper body mechanics, postural dysfunction, and pain.   ACTIVITY LIMITATIONS: carrying, lifting, standing, squatting, and stairs  PERSONAL  FACTORS: Age, Fitness, Past/current experiences, and Time since onset of injury/illness/exacerbation are also affecting patient's functional outcome.   REHAB POTENTIAL: Good  CLINICAL DECISION MAKING: Evolving/moderate complexity  EVALUATION COMPLEXITY: Low   GOALS: Goals reviewed with patient? No  SHORT TERM GOALS: Target date: 08/30/2022   Patient to demonstrate independence in HEP  Baseline: [id number] Goal status: Met  2.  Patient to negotiate 16 stairs in boot safely with most appropriate pattern. Baseline: TBD; 08/16/22 16 steps with step to pattern and B rail assist; 10/02/22 Step through pattern w/o rails Goal status: Met    LONG TERM GOALS: Target date: 10/21/2022    Increase R ankle strength to 4/5 Baseline:   R 08/09/22 R 10/02/22  Ankle dorsiflexion 3 4  Ankle plantarflexion 3            4-      Goal status: Ongoing  2.  Increase A/PROM to 8/10d DF Baseline:   R 08/09/22 R 10/02/22  Ankle dorsiflexion 4/6d 10/12d  Ankle plantarflexion 30d         30d    Goal status: Met  3.  219ft ambulation w/o CAM boot Baseline: 45ft with CAM boot; 09/13/22 Unlimited w/o boot Goal status: Met  4.  Patient to negotiate 16 steps with most appropriate pattern Baseline: TBD; 10/02/22 16 steps with step through pattern no rails  Goal status: Met  5.  Increase FOTO score to 79 Baseline: 54; 67 Goal status: Progressing     PLAN:  PT FREQUENCY: 1EOW  PT DURATION: 4 weeks  PLANNED INTERVENTIONS: Therapeutic exercises, Therapeutic activity, Neuromuscular re-education, Balance training, Gait training, Patient/Family education, Self Care, Joint mobilization, Stair training, DME instructions, Aquatic Therapy, Dry Needling, Electrical stimulation, Cryotherapy,  Moist heat, Manual therapy, and Re-evaluation  PLAN FOR NEXT SESSION: HEP review and update, manual techniques as appropriate, aerobic tasks, ROM and flexibility activities, strengthening and PREs, TPDN, gait and  balance training as needed     Hildred Laser, PT 10/29/2022, 4:25 PM   Check all possible CPT codes: 16109 - PT Re-evaluation, 97110- Therapeutic Exercise, [phone]- Neuro Re-education, [phone] - Gait Training, [phone] - Manual Therapy, [phone] - Therapeutic Activities, and [phone] - Self Care    Check all conditions that are expected to impact treatment: {Conditions expected to impact treatment:Musculoskeletal disorders, Contractures, spasticity or fracture relevant to requested treatment, and Structural or anatomic abnormalities   If treatment provided at initial evaluation, no treatment charged due to lack of authorization.

## 2022-10-30 ENCOUNTER — Ambulatory Visit: Payer: 59 | Attending: Podiatry

## 2022-10-30 ENCOUNTER — Telehealth: Payer: Self-pay

## 2022-10-30 NOTE — Telephone Encounter (Signed)
TC due to missed visit.  Spoke directly to patient.  He related his transportation did to show up.  Declined to reschedule as he has returned to baseline function.

## 2022-10-31 LAB — WOUND CULTURE
MICRO NUMBER:: 15438987
SPECIMEN QUALITY:: ADEQUATE

## 2022-10-31 LAB — PAT ID TIQ DOC: Test Affected: 18881

## 2022-11-07 ENCOUNTER — Ambulatory Visit (INDEPENDENT_AMBULATORY_CARE_PROVIDER_SITE_OTHER): Payer: 59 | Admitting: Podiatry

## 2022-11-07 ENCOUNTER — Ambulatory Visit (INDEPENDENT_AMBULATORY_CARE_PROVIDER_SITE_OTHER): Payer: 59

## 2022-11-07 DIAGNOSIS — L97412 Non-pressure chronic ulcer of right heel and midfoot with fat layer exposed: Secondary | ICD-10-CM | POA: Diagnosis not present

## 2022-11-07 DIAGNOSIS — M216X1 Other acquired deformities of right foot: Secondary | ICD-10-CM

## 2022-11-08 NOTE — Progress Notes (Signed)
Subjective:  Patient ID: Justin Duran, male    DOB: 23-Feb-2000,  MRN: [id number]  No chief complaint on file.   22 y.o. male returns for post-op check.  Has not seen any drainage has been able to leave open air has been using the collagen powder Review of Systems: Negative except as noted in the HPI. Denies N/V/F/Ch.   Objective:  There were no vitals filed for this visit. There is no height or weight on file to calculate BMI. Constitutional Well developed. Well nourished.  Vascular Foot warm and well perfused. Capillary refill normal to all digits.  Calf is soft and supple, no posterior calf or knee pain, negative Homans' sign  Neurologic Normal speech. Oriented to person, place, and time. Epicritic sensation to light touch grossly present bilaterally.  Dermatologic   Posterior heel ulceration has now epithelialized and there is no active drainage or open areas  Orthopedic: No pain to palpation, good range of motion of subtalar and ankle joint     Narrative & Impression  CLINICAL DATA:  Calcaneal fracture   EXAM: CT OF THE RIGHT FOOT WITHOUT CONTRAST   TECHNIQUE: Multidetector CT imaging of the right foot was performed according to the standard protocol. Multiplanar CT image reconstructions were also generated.   RADIATION DOSE REDUCTION: This exam was performed according to the departmental dose-optimization program which includes automated exposure control, adjustment of the mA and/or kV according to patient size and/or use of iterative reconstruction technique.   COMPARISON:  05/23/2022 radiographs and CT from 12/12/2021   FINDINGS: Bones/Joint/Cartilage   Faint residual visibility of portions of the posterior calcaneal fracture, with fracture plane traversed by 2 screws and with the posterior fragment having partially fused in with 1.2 cm of proximal displacement of the posterior fragment. There is some bandlike periosteal reaction or heterotopic  calcification along the posteroinferior calcaneus as shown on image 58 series 7, adjacent to part of the fracture plane.   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 There is cortication along the osteotomy margins.   Bony demineralization noted especially along the cortex in a diffuse manner.   Ligaments   Suboptimally assessed by CT.   Muscles and Tendons   Mild flexor hallucis longus tenosynovitis just proximal to the knot of Henry. Tendinopathy and expansion of the peroneus longus tendon. Attenuated attachment of the tibialis posterior to the navicular, correlate clinically in assessing for tibialis posterior dysfunction.   Distal Achilles tendinopathy. Indistinctness and thickening of the proximal plantar fascia which attaches in the vicinity of the fracture, biomechanical integrity uncertain.   Soft tissues   Subcutaneous edema along the heel, especially posterolaterally and along the posteroinferior heel.   IMPRESSION: 1. Partial union the posterior calcaneal fracture, with fracture plane traversed by 2 screws and with 1.2 cm of partially fused in cephalad displacement of the posterior fragment. 2. Nonunited first metatarsal osteotomy with a dorsal staple type fixator in place. 3. Tendinopathy and expansion of the peroneus longus tendon. 4. Attenuated attachment of the tibialis posterior to the navicular, correlate clinically in assessing for tibialis posterior dysfunction. 5. Distal Achilles tendinopathy. 6. Indistinctness and thickening of the proximal plantar fascia which attaches in the vicinity of the fracture, biomechanical integrity uncertain. 7. Subcutaneous edema along the heel, especially posterolaterally and along the posteroinferior heel.     Electronically Signed   By: Gaylyn Rong M.D.   On: 05/24/2022 10:53     Multiple  view plain film radiographs: New films taken the  show continue complete consolidation across the fracture and osteotomy sites  Assessment:   1. Ulcer of right heel and midfoot with fat layer exposed (HCC)   2. Acquired cavovarus deformity of foot, right     Plan:  Patient was evaluated and treated and all questions answered.  S/p foot surgery right -Ulceration has healed at this point.  May leave open to air, utilize collagen if it begins to drain or open up again.  Long-term we discussed removal of the screws if it continues to persist or continues to be a painful area.  Hopefully can avoid at this point.  He may continue regular shoe gear and activity as tolerated.  Return in about 4 weeks (around 12/05/2022) for wound check.

## 2022-11-21 DIAGNOSIS — Z419 Encounter for procedure for purposes other than remedying health state, unspecified: Secondary | ICD-10-CM | POA: Diagnosis not present

## 2022-12-05 ENCOUNTER — Encounter: Payer: Self-pay | Admitting: Podiatry

## 2022-12-05 ENCOUNTER — Ambulatory Visit (INDEPENDENT_AMBULATORY_CARE_PROVIDER_SITE_OTHER): Payer: 59 | Admitting: Podiatry

## 2022-12-05 DIAGNOSIS — S92042A Displaced other fracture of tuberosity of left calcaneus, initial encounter for closed fracture: Secondary | ICD-10-CM | POA: Diagnosis not present

## 2022-12-05 DIAGNOSIS — M6788 Other specified disorders of synovium and tendon, other site: Secondary | ICD-10-CM

## 2022-12-05 DIAGNOSIS — M25371 Other instability, right ankle: Secondary | ICD-10-CM

## 2022-12-05 NOTE — Patient Instructions (Signed)
Call [facility] to schedule your CT and ultrasound at the below locations.  Please allow at least 1 business day after your visit to process the referral.  It may take longer depending on approval from insurance.  Please let me know if you have issues or problems scheduling the CT and ultrasound   [facility] [place] [phone] [address] [place] [address] [place], [place] [address]  [facility] [phone] [address] [place], [place] [address]

## 2022-12-06 NOTE — Progress Notes (Signed)
Subjective:  Patient ID: Justin Duran, male    DOB: [birth year],  MRN: [id number]  Chief Complaint  Patient presents with   Wound Check    Patient is here for 4WK F/U for wound care    22 y.o. male returns for post-op check.  His wound is healed and has not had any drainage.  He does note that he has had pain on the side of the ankle as he has been walking on his foot a lot  Review of Systems: Negative except as noted in the HPI. Denies N/V/F/Ch.   Objective:  There were no vitals filed for this visit. There is no height or weight on file to calculate BMI. Constitutional Well developed. Well nourished.  Vascular Foot warm and well perfused. Capillary refill normal to all digits.  Calf is soft and supple, no posterior calf or knee pain, negative Homans' sign  Neurologic Normal speech. Oriented to person, place, and time. Epicritic sensation to light touch grossly present bilaterally.  Dermatologic   Posterior heel ulceration remains healed with overlying scab and hyperkeratosis  Orthopedic: Today has some pain to palpation over the ATFL and CFL and sinus tarsi and lateral calcaneus     Narrative & Impression  CLINICAL DATA:  Calcaneal fracture   EXAM: CT OF THE RIGHT FOOT WITHOUT CONTRAST   TECHNIQUE: Multidetector CT imaging of the right foot was performed according to the standard protocol. Multiplanar CT image reconstructions were also generated.   RADIATION DOSE REDUCTION: This exam was performed according to the departmental dose-optimization program which includes automated exposure control, adjustment of the mA and/or kV according to patient size and/or use of iterative reconstruction technique.   COMPARISON:  05/23/2022 radiographs and CT from 12/12/2021   FINDINGS: Bones/Joint/Cartilage   Faint residual visibility of portions of the posterior calcaneal fracture, with fracture plane traversed by 2 screws and with the posterior fragment having partially  fused in with 1.2 cm of proximal displacement of the posterior fragment. There is some bandlike periosteal reaction or heterotopic calcification along the posteroinferior calcaneus as shown on image 58 series 7, adjacent to part of the fracture plane.   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 There is cortication along the osteotomy margins.   Bony demineralization noted especially along the cortex in a diffuse manner.   Ligaments   Suboptimally assessed by CT.   Muscles and Tendons   Mild flexor hallucis longus tenosynovitis just proximal to the knot of Henry. Tendinopathy and expansion of the peroneus longus tendon. Attenuated attachment of the tibialis posterior to the navicular, correlate clinically in assessing for tibialis posterior dysfunction.   Distal Achilles tendinopathy. Indistinctness and thickening of the proximal plantar fascia which attaches in the vicinity of the fracture, biomechanical integrity uncertain.   Soft tissues   Subcutaneous edema along the heel, especially posterolaterally and along the posteroinferior heel.   IMPRESSION: 1. Partial union the posterior calcaneal fracture, with fracture plane traversed by 2 screws and with 1.2 cm of partially fused in cephalad displacement of the posterior fragment. 2. Nonunited first metatarsal osteotomy with a dorsal staple type fixator in place. 3. Tendinopathy and expansion of the peroneus longus tendon. 4. Attenuated attachment of the tibialis posterior to the navicular, correlate clinically in assessing for tibialis posterior dysfunction. 5. Distal Achilles tendinopathy. 6. Indistinctness and thickening of the proximal plantar fascia which attaches in the vicinity of the fracture, biomechanical integrity uncertain. 7.  Subcutaneous edema along the heel, especially posterolaterally and along the posteroinferior heel.      Electronically Signed   By: Gaylyn Rong M.D.   On: 05/24/2022 10:53     Multiple view plain film radiographs: New films taken the show continue complete consolidation across the fracture and osteotomy sites  Assessment:   1. Other closed displaced fracture of tuberosity of left calcaneus, initial encounter   2. Peroneal tendinosis, right   3. Ankle instability, right     Plan:  Patient was evaluated and treated and all questions answered.  S/p foot surgery right -His wound remains healed.  He may continue to be WBAT in regular shoes, I discussed with him I would utilize his lace up ankle brace is much as possible for right now, he is having pain on the lateral ankle and I recommended evaluating the lateral ankle ligaments with a ultrasound.  This has been ordered.  I also recommended reevaluating his fracture and osteotomy healing with a CT scan and this has been ordered as well.  I will see him back after both studies to reevaluate.  No follow-ups on file.

## 2022-12-22 DIAGNOSIS — Z419 Encounter for procedure for purposes other than remedying health state, unspecified: Secondary | ICD-10-CM | POA: Diagnosis not present

## 2022-12-26 ENCOUNTER — Other Ambulatory Visit: Payer: 59

## 2023-01-02 ENCOUNTER — Ambulatory Visit
Admission: RE | Admit: 2023-01-02 | Discharge: 2023-01-02 | Disposition: A | Discharge status: Home / Self Care | Payer: 59 | Source: Ambulatory Visit | Attending: Podiatry | Admitting: Podiatry

## 2023-01-02 DIAGNOSIS — M6788 Other specified disorders of synovium and tendon, other site: Secondary | ICD-10-CM

## 2023-01-02 DIAGNOSIS — M25371 Other instability, right ankle: Secondary | ICD-10-CM

## 2023-01-08 ENCOUNTER — Ambulatory Visit (INDEPENDENT_AMBULATORY_CARE_PROVIDER_SITE_OTHER): Payer: 59 | Admitting: Podiatry

## 2023-01-08 DIAGNOSIS — M6788 Other specified disorders of synovium and tendon, other site: Secondary | ICD-10-CM | POA: Diagnosis not present

## 2023-01-10 ENCOUNTER — Encounter: Payer: Self-pay | Admitting: Podiatry

## 2023-01-10 NOTE — Progress Notes (Signed)
Subjective:  Patient ID: Justin Duran, male    DOB: 01/17/2001,  MRN: [id number]  Chief Complaint  Patient presents with   Foot Pain    RM#21 Follow up right foot pain since 03/17/2022 after surgery had ultrasound and is here for results.    22 y.o. male returns for post-op check.  He returns for follow-up completed the ultrasound the CT scan has not been scheduled he said some days it feels like surgery did not happen  Review of Systems: Negative except as noted in the HPI. Denies N/V/F/Ch.   Objective:  There were no vitals filed for this visit. There is no height or weight on file to calculate BMI. Constitutional Well developed. Well nourished.  Vascular Foot warm and well perfused. Capillary refill normal to all digits.  Calf is soft and supple, no posterior calf or knee pain, negative Homans' sign  Neurologic Normal speech. Oriented to person, place, and time. Epicritic sensation to light touch grossly present bilaterally.  Dermatologic   Posterior heel ulceration remains healed with overlying hyperkeratosis  Orthopedic: Today has some pain to palpation over the ATFL and CFL and sinus tarsi and lateral calcaneus     Narrative & Impression  CLINICAL DATA:  Calcaneal fracture   EXAM: CT OF THE RIGHT FOOT WITHOUT CONTRAST   TECHNIQUE: Multidetector CT imaging of the right foot was performed according to the standard protocol. Multiplanar CT image reconstructions were also generated.   RADIATION DOSE REDUCTION: This exam was performed according to the departmental dose-optimization program which includes automated exposure control, adjustment of the mA and/or kV according to patient size and/or use of iterative reconstruction technique.   COMPARISON:  05/23/2022 radiographs and CT from 12/12/2021   FINDINGS: Bones/Joint/Cartilage   Faint residual visibility of portions of the posterior calcaneal fracture, with fracture plane traversed by 2 screws and with  the posterior fragment having partially fused in with 1.2 cm of proximal displacement of the posterior fragment. There is some bandlike periosteal reaction or heterotopic calcification along the posteroinferior calcaneus as shown on image 58 series 7, adjacent to part of the fracture plane.   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 There is cortication along the osteotomy margins.   Bony demineralization noted especially along the cortex in a diffuse manner.   Ligaments   Suboptimally assessed by CT.   Muscles and Tendons   Mild flexor hallucis longus tenosynovitis just proximal to the knot of Henry. Tendinopathy and expansion of the peroneus longus tendon. Attenuated attachment of the tibialis posterior to the navicular, correlate clinically in assessing for tibialis posterior dysfunction.   Distal Achilles tendinopathy. Indistinctness and thickening of the proximal plantar fascia which attaches in the vicinity of the fracture, biomechanical integrity uncertain.   Soft tissues   Subcutaneous edema along the heel, especially posterolaterally and along the posteroinferior heel.   IMPRESSION: 1. Partial union the posterior calcaneal fracture, with fracture plane traversed by 2 screws and with 1.2 cm of partially fused in cephalad displacement of the posterior fragment. 2. Nonunited first metatarsal osteotomy with a dorsal staple type fixator in place. 3. Tendinopathy and expansion of the peroneus longus tendon. 4. Attenuated attachment of the tibialis posterior to the navicular, correlate clinically in assessing for tibialis posterior dysfunction. 5. Distal Achilles tendinopathy. 6. Indistinctness and thickening of the proximal plantar fascia which attaches in the vicinity of the fracture, biomechanical integrity uncertain. 7. Subcutaneous edema along the  heel, especially posterolaterally and along the  posteroinferior heel.     Electronically Signed   By: Gaylyn Rong M.D.   On: 05/24/2022 10:53   Narrative & Impression  CLINICAL DATA:  Lateral ankle pain. History of foot surgery for pes cavus deformity, as well as the calcaneal fracture earlier this year.   EXAM: ULTRASOUND RIGHT LOWER EXTREMITY LIMITED   TECHNIQUE: Ultrasound examination of the lower extremity soft tissues was performed in the area of clinical concern.   COMPARISON:  CT right foot dated May 24, 2022. MRI right ankle dated December 05, 2021.   FINDINGS: Focused ultrasound of the lateral ankle demonstrate mild thickening and hypoechogenicity within the peroneal brevis and longus tendons near a prominent peroneal tubercle. No tear.   Intact anterior talofibular and calcaneofibular ligaments.   IMPRESSION: 1. Mild peroneal brevis and longus tendinosis near a prominent peroneal tubercle. No tear. 2. Intact lateral ankle ligaments.     Electronically Signed   By: Obie Dredge M.D.   On: 01/02/2023 15:31      Multiple view plain film radiographs:complete consolidation across the fracture and osteotomy sites  Assessment:   No diagnosis found.   Plan:  Patient was evaluated and treated and all questions answered.  S/p foot surgery right -Pain is still fairly bothersome and limiting overlying the peroneal tendons.  There is tendinosis and a prominent peroneal tubercle.  This is the area of maximum tenderness for him.  I recommend a corticosteroid injection.  Following sterile prep with alcohol and consent the peroneal tendon sheath was injected with 10 mg of Kenalog 2 mg of dexamethasone and 1 cc of 0.5% Marcaine plain.  He tolerated this well.  I confirmed approval of his CT scan with the imaging center and they were going to schedule him.  I will see him back after the scan and let him know with the results of the show  No follow-ups on file.

## 2023-01-21 DIAGNOSIS — Z419 Encounter for procedure for purposes other than remedying health state, unspecified: Secondary | ICD-10-CM | POA: Diagnosis not present

## 2023-01-25 ENCOUNTER — Ambulatory Visit
Admission: RE | Admit: 2023-01-25 | Discharge: 2023-01-25 | Disposition: A | Discharge status: Home / Self Care | Payer: 59 | Source: Ambulatory Visit | Attending: Podiatry | Admitting: Podiatry

## 2023-01-25 DIAGNOSIS — S92042A Displaced other fracture of tuberosity of left calcaneus, initial encounter for closed fracture: Secondary | ICD-10-CM

## 2023-02-06 ENCOUNTER — Encounter: Payer: Medicaid Other | Admitting: Nurse Practitioner

## 2023-02-07 ENCOUNTER — Encounter: Payer: Self-pay | Admitting: Nurse Practitioner

## 2023-02-21 DIAGNOSIS — Z419 Encounter for procedure for purposes other than remedying health state, unspecified: Secondary | ICD-10-CM | POA: Diagnosis not present

## 2023-03-01 ENCOUNTER — Ambulatory Visit (INDEPENDENT_AMBULATORY_CARE_PROVIDER_SITE_OTHER): Payer: Commercial Managed Care - HMO | Admitting: Podiatry

## 2023-03-01 DIAGNOSIS — M25571 Pain in right ankle and joints of right foot: Secondary | ICD-10-CM | POA: Diagnosis not present

## 2023-03-04 NOTE — Progress Notes (Signed)
 Subjective:  Patient ID: Justin Duran, male    DOB: [birth year],  MRN: [id number]  Chief Complaint  Patient presents with   Foot Pain    He is here for MRI results of right foot, he has some pain today with the weather change.     23 y.o. male returns for post-op check.  He returns for follow-up he can do the CT and notes that it is feeling better than had been Review of Systems: Negative except as noted in the HPI. Denies N/V/F/Ch.   Objective:  There were no vitals filed for this visit. There is no height or weight on file to calculate BMI. Constitutional Well developed. Well nourished.  Vascular Foot warm and well perfused. Capillary refill normal to all digits.  Calf is soft and supple, no posterior calf or knee pain, negative Homans' sign  Neurologic Normal speech. Oriented to person, place, and time. Epicritic sensation to light touch grossly present bilaterally.  Dermatologic   Posterior heel ulceration remains healed with overlying hyperkeratosis  Orthopedic: Today no pain over lateral ligaments he does have still pain over the sinus tarsi and not over the body the calcaneus posteriorly or laterally     Narrative & Impression  CLINICAL DATA:  Calcaneal fracture   EXAM: CT OF THE RIGHT FOOT WITHOUT CONTRAST   TECHNIQUE: Multidetector CT imaging of the right foot was performed according to the standard protocol. Multiplanar CT image reconstructions were also generated.   RADIATION DOSE REDUCTION: This exam was performed according to the departmental dose-optimization program which includes automated exposure control, adjustment of the mA and/or kV according to patient size and/or use of iterative reconstruction technique.   COMPARISON:  05/23/2022 radiographs and CT from 12/12/2021   FINDINGS: Bones/Joint/Cartilage   Faint residual visibility of portions of the posterior calcaneal fracture, with fracture plane traversed by 2 screws and with the posterior  fragment having partially fused in with 1.2 cm of proximal displacement of the posterior fragment. There is some bandlike periosteal reaction or heterotopic calcification along the posteroinferior calcaneus as shown on image 58 series 7, adjacent to part of the fracture plane.   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 There is cortication along the osteotomy margins.   Bony demineralization noted especially along the cortex in a diffuse manner.   Ligaments   Suboptimally assessed by CT.   Muscles and Tendons   Mild flexor hallucis longus tenosynovitis just proximal to the knot of Henry. Tendinopathy and expansion of the peroneus longus tendon. Attenuated attachment of the tibialis posterior to the navicular, correlate clinically in assessing for tibialis posterior dysfunction.   Distal Achilles tendinopathy. Indistinctness and thickening of the proximal plantar fascia which attaches in the vicinity of the fracture, biomechanical integrity uncertain.   Soft tissues   Subcutaneous edema along the heel, especially posterolaterally and along the posteroinferior heel.   IMPRESSION: 1. Partial union the posterior calcaneal fracture, with fracture plane traversed by 2 screws and with 1.2 cm of partially fused in cephalad displacement of the posterior fragment. 2. Nonunited first metatarsal osteotomy with a dorsal staple type fixator in place. 3. Tendinopathy and expansion of the peroneus longus tendon. 4. Attenuated attachment of the tibialis posterior to the navicular, correlate clinically in assessing for tibialis posterior dysfunction. 5. Distal Achilles tendinopathy. 6. Indistinctness and thickening of the proximal plantar fascia which attaches in the vicinity of the fracture, biomechanical integrity uncertain. 7. Subcutaneous  edema along the heel, especially posterolaterally and along the posteroinferior  heel.     Electronically Signed   By: Ryan Salvage M.D.   On: 05/24/2022 10:53   Narrative & Impression  CLINICAL DATA:  Lateral ankle pain. History of foot surgery for pes cavus deformity, as well as the calcaneal fracture earlier this year.   EXAM: ULTRASOUND RIGHT LOWER EXTREMITY LIMITED   TECHNIQUE: Ultrasound examination of the lower extremity soft tissues was performed in the area of clinical concern.   COMPARISON:  CT right foot dated May 24, 2022. MRI right ankle dated December 05, 2021.   FINDINGS: Focused ultrasound of the lateral ankle demonstrate mild thickening and hypoechogenicity within the peroneal brevis and longus tendons near a prominent peroneal tubercle. No tear.   Intact anterior talofibular and calcaneofibular ligaments.   IMPRESSION: 1. Mild peroneal brevis and longus tendinosis near a prominent peroneal tubercle. No tear. 2. Intact lateral ankle ligaments.     Electronically Signed   By: Elsie ONEIDA Shoulder M.D.   On: 01/02/2023 15:31     IMPRESSION: 1. Healed posterior calcaneal fracture transfixed with 2 cannulated screws. No hardware failure or complication. 2. First metatarsal base osteotomy transfixed with a dorsal staple and with osseous bridging across the majority of the osteotomy site with a small area of persistent lucency along the dorsal medial aspect of. 3. Relative thickening of the medial band of the plantar fascia towards the calcaneal insertion.     Electronically Signed   By: Julaine Blanch M.D.   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  S/p foot surgery right -Last injection was helpful.  He is feeling somewhat better.  Continues to improve as he build strength and conditioning.  He does have some  symptoms of sinus tarsitis.  I recommended corticosteroid injection for diagnostic and therapeutic purposes as well to see if this alleviates any further pain.  Following consent the right sinus tarsi and subtalar joint was injected with 10 mg of Kenalog 4 mg dexamethasone  and 0.5 cc each of 0.5% Marcaine  and due to lidocaine  plain he tolerates well and let me know if this is further problematic for him or not. Return if symptoms worsen or fail to improve.

## 2023-03-24 DIAGNOSIS — Z419 Encounter for procedure for purposes other than remedying health state, unspecified: Secondary | ICD-10-CM | POA: Diagnosis not present

## 2023-04-21 DIAGNOSIS — Z419 Encounter for procedure for purposes other than remedying health state, unspecified: Secondary | ICD-10-CM | POA: Diagnosis not present

## 2023-04-30 ENCOUNTER — Ambulatory Visit (INDEPENDENT_AMBULATORY_CARE_PROVIDER_SITE_OTHER)

## 2023-04-30 ENCOUNTER — Ambulatory Visit (INDEPENDENT_AMBULATORY_CARE_PROVIDER_SITE_OTHER): Payer: Commercial Managed Care - HMO | Admitting: Podiatry

## 2023-04-30 ENCOUNTER — Encounter: Payer: Self-pay | Admitting: Podiatry

## 2023-04-30 VITALS — Ht 71.0 in | Wt 235.0 lb

## 2023-04-30 DIAGNOSIS — S92042S Displaced other fracture of tuberosity of left calcaneus, sequela: Secondary | ICD-10-CM | POA: Diagnosis not present

## 2023-04-30 DIAGNOSIS — Z9889 Other specified postprocedural states: Secondary | ICD-10-CM

## 2023-04-30 DIAGNOSIS — M216X1 Other acquired deformities of right foot: Secondary | ICD-10-CM | POA: Diagnosis not present

## 2023-05-01 NOTE — Progress Notes (Signed)
 Subjective:  Patient ID: Justin Duran, male    DOB: August 10, 2000,  MRN: [id number]  Chief Complaint  Patient presents with   Post-op Problem    Pt is here to see if screws in foot could be took out due to pain.    23 y.o. male returns for post-op check.  He returns for follow-up having pain in the back of the heel  Review of Systems: Negative except as noted in the HPI. Denies N/V/F/Ch.   Objective:  There were no vitals filed for this visit. Body mass index is 32.78 kg/m. Constitutional Well developed. Well nourished.  Vascular Foot warm and well perfused. Capillary refill normal to all digits.  Calf is soft and supple, no posterior calf or knee pain, negative Homans' sign  Neurologic Normal speech. Oriented to person, place, and time. Epicritic sensation to light touch grossly present bilaterally.  Dermatologic   Posterior heel ulceration remains healed with overlying hyperkeratosis  Orthopedic: Today no pain over lateral ligaments or sinus tarsi,, some tenderness and stiffness over the EHL tendon and posterior heel     Narrative & Impression  CLINICAL DATA:  Calcaneal fracture   EXAM: CT OF THE RIGHT FOOT WITHOUT CONTRAST   TECHNIQUE: Multidetector CT imaging of the right foot was performed according to the standard protocol. Multiplanar CT image reconstructions were also generated.   RADIATION DOSE REDUCTION: This exam was performed according to the departmental dose-optimization program which includes automated exposure control, adjustment of the mA and/or kV according to patient size and/or use of iterative reconstruction technique.   COMPARISON:  05/23/2022 radiographs and CT from 12/12/2021   FINDINGS: Bones/Joint/Cartilage   Faint residual visibility of portions of the posterior calcaneal fracture, with fracture plane traversed by 2 screws and with the posterior fragment having partially fused in with 1.2 cm of proximal displacement of the posterior  fragment. There is some bandlike periosteal reaction or heterotopic calcification along the posteroinferior calcaneus as shown on image 58 series 7, adjacent to part of the fracture plane.   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 There is cortication along the osteotomy margins.   Bony demineralization noted especially along the cortex in a diffuse manner.   Ligaments   Suboptimally assessed by CT.   Muscles and Tendons   Mild flexor hallucis longus tenosynovitis just proximal to the knot of Henry. Tendinopathy and expansion of the peroneus longus tendon. Attenuated attachment of the tibialis posterior to the navicular, correlate clinically in assessing for tibialis posterior dysfunction.   Distal Achilles tendinopathy. Indistinctness and thickening of the proximal plantar fascia which attaches in the vicinity of the fracture, biomechanical integrity uncertain.   Soft tissues   Subcutaneous edema along the heel, especially posterolaterally and along the posteroinferior heel.   IMPRESSION: 1. Partial union the posterior calcaneal fracture, with fracture plane traversed by 2 screws and with 1.2 cm of partially fused in cephalad displacement of the posterior fragment. 2. Nonunited first metatarsal osteotomy with a dorsal staple type fixator in place. 3. Tendinopathy and expansion of the peroneus longus tendon. 4. Attenuated attachment of the tibialis posterior to the navicular, correlate clinically in assessing for tibialis posterior dysfunction. 5. Distal Achilles tendinopathy. 6. Indistinctness and thickening of the proximal plantar fascia which attaches in the vicinity of the fracture, biomechanical integrity uncertain. 7. Subcutaneous edema along the heel, especially posterolaterally and along the posteroinferior heel.     Electronically Signed  By: Gaylyn Rong M.D.   On: 05/24/2022 10:53    Narrative & Impression  CLINICAL DATA:  Lateral ankle pain. History of foot surgery for pes cavus deformity, as well as the calcaneal fracture earlier this year.   EXAM: ULTRASOUND RIGHT LOWER EXTREMITY LIMITED   TECHNIQUE: Ultrasound examination of the lower extremity soft tissues was performed in the area of clinical concern.   COMPARISON:  CT right foot dated May 24, 2022. MRI right ankle dated December 05, 2021.   FINDINGS: Focused ultrasound of the lateral ankle demonstrate mild thickening and hypoechogenicity within the peroneal brevis and longus tendons near a prominent peroneal tubercle. No tear.   Intact anterior talofibular and calcaneofibular ligaments.   IMPRESSION: 1. Mild peroneal brevis and longus tendinosis near a prominent peroneal tubercle. No tear. 2. Intact lateral ankle ligaments.     Electronically Signed   By: Obie Dredge M.D.   On: 01/02/2023 15:31     IMPRESSION: 1. Healed posterior calcaneal fracture transfixed with 2 cannulated screws. No hardware failure or complication. 2. First metatarsal base osteotomy transfixed with a dorsal staple and with osseous bridging across the majority of the osteotomy site with a small area of persistent lucency along the dorsal medial aspect of. 3. Relative thickening of the medial band of the plantar fascia towards the calcaneal insertion.     Electronically Signed   By: Elige Ko M.D.   On: 02/07/2023 13:35    Multiple view plain film radiographs: No change in alignment or fusion site or osteotomy, all sites are healed  Assessment:   1. Other closed displaced fracture of tuberosity of left calcaneus, sequela   2. Acquired cavovarus deformity of foot, right      Plan:  Patient was evaluated and treated and all questions answered.  S/p foot surgery right -He returns for follow-up still having pain in the posterior heel and now the dorsal midfoot over the EHL tendon.  Discussed this  could be due to the internal implants.  We discussed removal as an option and he would like to proceed with this.  We discussed the risks and benefits of this including the risk of nonhealing of the posterior incision.  His wound has completely healed at this point.  Outpatient heart removal will be scheduled.  All questions addressed.  Informed consent signed and reviewed.   Surgical plan:  Procedure: -Removal of hardware right foot  Location: -[facility]  Anesthesia plan: -Sedation with block  Postoperative pain plan: - Tylenol 1000 mg every 6 hours, ibuprofen 600 mg every 6 hours, gabapentin 300 mg every 8 hours x5 days, oxycodone 5 mg 1-2 tabs every 6 hours only as needed  DVT prophylaxis: -None required  WB Restrictions / DME needs: -WBAT in CAM boot postop   No follow-ups on file.

## 2023-05-29 ENCOUNTER — Telehealth: Payer: Self-pay | Admitting: Podiatry

## 2023-05-29 NOTE — Telephone Encounter (Signed)
 DOS: 06/22/23  REMOVAL OF HARDWARE  20680   EFFECTIVE DATE: 07/22/2022 - 07/21/2023   DEDUCTIBLE:  $3,200.00  REMAINING:  $3,200.00  OOP:  $6,000.00  REMAINING:  $2,626.77   CO INSURANCE : 20%  PER [employer] PROVIDER PORTAL NO PRIOR AUTH IS REQ FOR THIS CPT CODE 20680  REF# [id number]

## 2023-06-02 DIAGNOSIS — Z419 Encounter for procedure for purposes other than remedying health state, unspecified: Secondary | ICD-10-CM | POA: Diagnosis not present

## 2023-06-21 ENCOUNTER — Other Ambulatory Visit: Payer: Self-pay | Admitting: Podiatry

## 2023-06-21 MED ORDER — ACETAMINOPHEN 500 MG PO TABS: 1000.0000 mg | ORAL_TABLET | Freq: Four times a day (QID) | ORAL | 0 refills | Status: AC | PRN

## 2023-06-21 MED ORDER — IBUPROFEN 800 MG PO TABS: 800.0000 mg | ORAL_TABLET | Freq: Three times a day (TID) | ORAL | 0 refills | Status: AC | PRN

## 2023-06-21 MED ORDER — TRAMADOL HCL 50 MG PO TABS: 50.0000 mg | ORAL_TABLET | Freq: Four times a day (QID) | ORAL | 0 refills | Status: AC | PRN

## 2023-06-21 NOTE — Progress Notes (Signed)
 5-25 hardware removal

## 2023-06-22 DIAGNOSIS — T8484XA Pain due to internal orthopedic prosthetic devices, implants and grafts, initial encounter: Secondary | ICD-10-CM | POA: Diagnosis not present

## 2023-06-28 ENCOUNTER — Ambulatory Visit (INDEPENDENT_AMBULATORY_CARE_PROVIDER_SITE_OTHER)

## 2023-06-28 ENCOUNTER — Ambulatory Visit (INDEPENDENT_AMBULATORY_CARE_PROVIDER_SITE_OTHER): Admitting: Podiatry

## 2023-06-28 DIAGNOSIS — M216X1 Other acquired deformities of right foot: Secondary | ICD-10-CM

## 2023-06-28 NOTE — Progress Notes (Signed)
  Subjective:  Patient ID: Blase Bur, male    DOB: 2000/06/26,  MRN: [id number]  Chief Complaint  Patient presents with   Routine Post Op    POV # 1 DOS 06/22/23 --- SCREW REMOVAL AND STAPLE REMOVAL RIGHT FOOT    23 y.o. male returns for post-op check.  Doing well pain has been minimal  Review of Systems: Negative except as noted in the HPI. Denies N/V/F/Ch.   Objective:  There were no vitals filed for this visit. There is no height or weight on file to calculate BMI. Constitutional Well developed. Well nourished.  Vascular Foot warm and well perfused. Capillary refill normal to all digits.  Calf is soft and supple, no posterior calf or knee pain, negative Homans' sign  Neurologic Normal speech. Oriented to person, place, and time. Epicritic sensation to light touch grossly present bilaterally.  Dermatologic Skin healing well without signs of infection. Skin edges well coapted without signs of infection.  Orthopedic: Tenderness to palpation noted about the surgical site.   Multiple view plain film radiographs: Interval screw removal and staple removal healing well Assessment:   1. Acquired cavovarus deformity of foot, right    Plan:  Patient was evaluated and treated and all questions answered.  S/p foot surgery right -Progressing as expected post-operatively.  Light dressing applied today with iodine ointment and gauze and Ace wrap.  Remove on Monday and may shower.  Return in 2 weeks for suture removal.  May return to regular shoe gear and activity after that  No follow-ups on file.

## 2023-07-02 DIAGNOSIS — Z419 Encounter for procedure for purposes other than remedying health state, unspecified: Secondary | ICD-10-CM | POA: Diagnosis not present

## 2023-07-09 ENCOUNTER — Encounter: Admitting: Podiatry

## 2023-07-10 ENCOUNTER — Encounter: Admitting: Podiatry

## 2023-07-12 ENCOUNTER — Ambulatory Visit (INDEPENDENT_AMBULATORY_CARE_PROVIDER_SITE_OTHER): Admitting: Podiatry

## 2023-07-12 DIAGNOSIS — M216X1 Other acquired deformities of right foot: Secondary | ICD-10-CM

## 2023-07-12 DIAGNOSIS — Z9889 Other specified postprocedural states: Secondary | ICD-10-CM

## 2023-07-12 NOTE — Progress Notes (Signed)
  Subjective:  Patient ID: Justin Duran, male    DOB: 07-06-2000,  MRN: [id number]  Chief Complaint  Patient presents with   Post-op Follow-up    Doing better. Some residual swelling. Denies pain. Wearing cam boot. Sutures intact.     23 y.o. male returns for post-op check.  Patient reports no issues since last appointment he has some residual swelling but his pain is decreased and not really any pain at this point in time.  Wearing cam boot.  Walking with walker assist.  No concerns  Review of Systems: Negative except as noted in the HPI. Denies N/V/F/Ch.   Objective:  There were no vitals filed for this visit. There is no height or weight on file to calculate BMI. Constitutional Well developed. Well nourished.  Vascular Foot warm and well perfused. Capillary refill normal to all digits.  Calf is soft and supple, no posterior calf or knee pain, negative Homans' sign  Neurologic Normal speech. Oriented to person, place, and time. Epicritic sensation to light touch grossly present bilaterally.  Dermatologic Skin healing well without signs of infection. Skin edges well coapted without signs of infection.  Mild eschar at the back of the heel without evidence of wound or dehiscence.  Orthopedic: Decreased tenderness to palpation noted about the surgical site.   Multiple view plain film radiographs: Interval screw removal and staple removal healing well Assessment:   1. Acquired cavovarus deformity of foot, right   2. Post-operative state     Plan:  Patient was evaluated and treated and all questions answered.  S/p foot surgery right -Progressing as expected post-operatively.  No evidence of infection incisions healing nicely. - Removed sutures today - Continue wound care as needed okay to wash the foot with warm soapy water and then dry apply Band-Aid for protection as desired. -Okay to go back to regular shoe gear walking as tolerated -Follow-up per prior schedule with Dr.  Michalene Agee

## 2023-08-02 ENCOUNTER — Ambulatory Visit (INDEPENDENT_AMBULATORY_CARE_PROVIDER_SITE_OTHER): Admitting: Podiatry

## 2023-08-02 ENCOUNTER — Encounter: Payer: Self-pay | Admitting: Podiatry

## 2023-08-02 ENCOUNTER — Ambulatory Visit

## 2023-08-02 DIAGNOSIS — Z419 Encounter for procedure for purposes other than remedying health state, unspecified: Secondary | ICD-10-CM | POA: Diagnosis not present

## 2023-08-02 DIAGNOSIS — S92042A Displaced other fracture of tuberosity of left calcaneus, initial encounter for closed fracture: Secondary | ICD-10-CM

## 2023-08-02 NOTE — Progress Notes (Signed)
  Subjective:  Patient ID: Justin Duran, male    DOB: [birth year],  MRN: [id number]  Chief Complaint  Patient presents with   Routine Post Op    POV # 3 DOS 06/22/23 --- SCREW REMOVAL AND STAPLE REMOVAL RIGHT FOOT It's doing pretty good.  My heel still hurts every now and then,  There's nerve damage on top of my foot.     23 y.o. male returns for post-op check.  Doing well having pain under the heel  Review of Systems: Negative except as noted in the HPI. Denies N/V/F/Ch.   Objective:  There were no vitals filed for this visit. There is no height or weight on file to calculate BMI. Constitutional Well developed. Well nourished.  Vascular Foot warm and well perfused. Capillary refill normal to all digits.  Calf is soft and supple, no posterior calf or knee pain, negative Homans' sign  Neurologic Normal speech. Oriented to person, place, and time. Epicritic sensation to light touch grossly present bilaterally.  Dermatologic Skin healing well there is still some callus on the posterior heel  Orthopedic: No pain to palpation noted about the surgical site.  There is pain in the plantar heel   Multiple view plain film radiographs: Interval screw removal and staple removal healing well, overall alignment compared to preoperative original films is improved Assessment:   1. Other closed displaced fracture of tuberosity of left calcaneus, initial encounter    Plan:  Patient was evaluated and treated and all questions answered.  S/p foot surgery right - Overall doing well.  He is having some plantar heel pain which I expect will resolve with time.  He will utilize a gel heel cup and trial his work shoes and boots over the next week and let me know when he is ready to return to work.  Should be able to return without restrictions.  I discussed with him at this he will pain persist through the end of summer he will return to see me and we will do a corticosteroid injection.  Follow-up as  needed.  Return if symptoms worsen or fail to improve.

## 2023-08-17 ENCOUNTER — Encounter: Payer: Self-pay | Admitting: Podiatry

## 2023-08-20 ENCOUNTER — Encounter: Payer: Self-pay | Admitting: Podiatry

## 2023-08-20 NOTE — Telephone Encounter (Signed)
 Per Dr. Silva, pt is ok to return to work with no restrictions. Sent note via email [email or web address]

## 2023-09-01 DIAGNOSIS — Z419 Encounter for procedure for purposes other than remedying health state, unspecified: Secondary | ICD-10-CM | POA: Diagnosis not present

## 2023-09-13 DIAGNOSIS — Z0271 Encounter for disability determination: Secondary | ICD-10-CM

## 2023-09-13 NOTE — Telephone Encounter (Signed)
 Recd forms from [employer]. Faxed note/form to [phone]

## 2023-10-02 DIAGNOSIS — Z419 Encounter for procedure for purposes other than remedying health state, unspecified: Secondary | ICD-10-CM | POA: Diagnosis not present

## 2023-11-02 DIAGNOSIS — Z419 Encounter for procedure for purposes other than remedying health state, unspecified: Secondary | ICD-10-CM | POA: Diagnosis not present

## 2023-12-14 ENCOUNTER — Ambulatory Visit: Admitting: Nurse Practitioner

## 2023-12-14 VITALS — BP 120/78 | HR 65 | Temp 97.6°F | Ht 71.0 in | Wt 236.0 lb

## 2023-12-14 DIAGNOSIS — G4489 Other headache syndrome: Secondary | ICD-10-CM | POA: Diagnosis not present

## 2023-12-14 DIAGNOSIS — J029 Acute pharyngitis, unspecified: Secondary | ICD-10-CM

## 2023-12-14 DIAGNOSIS — R110 Nausea: Secondary | ICD-10-CM

## 2023-12-14 DIAGNOSIS — R1030 Lower abdominal pain, unspecified: Secondary | ICD-10-CM

## 2023-12-14 DIAGNOSIS — B349 Viral infection, unspecified: Secondary | ICD-10-CM

## 2023-12-14 DIAGNOSIS — A09 Infectious gastroenteritis and colitis, unspecified: Secondary | ICD-10-CM | POA: Diagnosis not present

## 2023-12-14 DIAGNOSIS — R051 Acute cough: Secondary | ICD-10-CM | POA: Diagnosis not present

## 2023-12-14 DIAGNOSIS — H938X1 Other specified disorders of right ear: Secondary | ICD-10-CM | POA: Diagnosis not present

## 2023-12-14 LAB — CBC
HCT: 46.9 % (ref 39.0–52.0)
Hemoglobin: 15.6 g/dL (ref 13.0–17.0)
MCHC: 33.3 g/dL (ref 30.0–36.0)
MCV: 83.9 fl (ref 78.0–100.0)
Platelets: 159 K/uL (ref 150.0–400.0)
RBC: 5.59 Mil/uL (ref 4.22–5.81)
RDW: 13 % (ref 11.5–15.5)
WBC: 4.5 K/uL (ref 4.0–10.5)

## 2023-12-14 LAB — POCT FLU A/B STATUS
Influenza A, POC: NEGATIVE
Influenza B, POC: NEGATIVE

## 2023-12-14 LAB — COMPREHENSIVE METABOLIC PANEL WITH GFR
ALT: 30 U/L (ref 0–53)
AST: 24 U/L (ref 0–37)
Albumin: 4.3 g/dL (ref 3.5–5.2)
Alkaline Phosphatase: 96 U/L (ref 39–117)
BUN: 14 mg/dL (ref 6–23)
CO2: 31 meq/L (ref 19–32)
Calcium: 9.3 mg/dL (ref 8.4–10.5)
Chloride: 105 meq/L (ref 96–112)
Creatinine, Ser: 1.05 mg/dL (ref 0.40–1.50)
GFR: 98.74 mL/min (ref >60.00)
Glucose, Bld: 94 mg/dL (ref 70–99)
Potassium: 5 meq/L (ref 3.5–5.1)
Sodium: 144 meq/L (ref 135–145)
Total Bilirubin: 0.6 mg/dL (ref 0.2–1.2)
Total Protein: 6.9 g/dL (ref 6.0–8.3)

## 2023-12-14 LAB — LIPASE: Lipase: 18 U/L (ref 11.0–59.0)

## 2023-12-14 LAB — MONONUCLEOSIS SCREEN: Mono Screen: NEGATIVE

## 2023-12-14 LAB — POCT RAPID STREP A (OFFICE): Rapid Strep A Screen: NEGATIVE

## 2023-12-14 LAB — POC COVID19 BINAXNOW: SARS Coronavirus 2 Ag: NEGATIVE

## 2023-12-14 MED ORDER — FLUTICASONE PROPIONATE 50 MCG/ACT NA SUSP
2.0000 | Freq: Every day | NASAL | 0 refills | Status: AC
Start: 2023-12-14 — End: ?

## 2023-12-14 MED ORDER — ONDANSETRON 4 MG PO TBDP
4.0000 mg | ORAL_TABLET | Freq: Three times a day (TID) | ORAL | 0 refills | Status: AC | PRN
Start: 2023-12-14 — End: ?

## 2023-12-14 NOTE — Patient Instructions (Addendum)
 Nice to see you today  You flu, Covid, and strep test were negative in office I will be in touch with the labs once I have them   Follow up with me in approx 6 months for your physical

## 2023-12-14 NOTE — Progress Notes (Signed)
 Established Patient Office Visit  Subjective   Patient ID: Justin Duran, male    DOB: [birth year]  Age: 23 y.o. MRN: [id number]  Chief Complaint  Patient presents with   GI Problem    Pt complains of cramping in lower abdominal area. States of nausea. Pt states stomach pain started two days ago.      Discussed the use of AI scribe software for clinical note transcription with the patient, who gave verbal consent to proceed.  History of Present Illness Justin Duran is a 23 year old male who presents with gastrointestinal and respiratory symptoms.  He began experiencing gastrointestinal symptoms two days ago, starting with stomach discomfort and diarrhea. Initially, he continued to work despite the symptoms, but they persisted, leading his employer to send him home. He has had one episode of diarrhea today. He describes intermittent lower abdominal pain as an aching sensation, not related to hunger or bowel movements. He reports a little bit of nausea but has not vomited.  He reports respiratory symptoms, including a sore throat and a mild cough, which he attributes to weather changes. No fever, chills, or shortness of breath. He has not received a flu shot or COVID-19 vaccine this season. For symptom relief, he has taken Tylenol  and ibuprofen , which have provided some relief for his sore throat.  He notes a sensation of fullness in his right ear and reports a history of ear tubes and past ear infections. He has had ear tubes in the past. No chest pain, fatigue, or body aches.  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     Review of Systems  Constitutional:  Negative for chills, fever and malaise/fatigue.  HENT:  Positive for ear pain and sore throat.   Respiratory:  Positive for cough. Negative for sputum production and shortness of breath.   Gastrointestinal:  Positive for abdominal pain, diarrhea and nausea. Negative for vomiting.   Genitourinary:  Negative for dysuria and hematuria.  Musculoskeletal:  Negative for myalgias.  Neurological:  Positive for headaches.      Objective:     BP 120/78   Pulse 65   Temp 97.6 F (36.4 C) (Oral)   Ht 5' 11 (1.803 m)   Wt 236 lb (107 kg)   SpO2 97%   BMI 32.92 kg/m    Physical Exam Vitals and nursing note reviewed.  Constitutional:      Appearance: Normal appearance. He is obese.  HENT:     Right Ear: Ear canal and external ear normal.     Left Ear: Tympanic membrane, ear canal and external ear normal.     Nose:     Right Sinus: No maxillary sinus tenderness or frontal sinus tenderness.     Left Sinus: No maxillary sinus tenderness or frontal sinus tenderness.     Mouth/Throat:     Mouth: Mucous membranes are moist.     Pharynx: Posterior oropharyngeal erythema present.  Cardiovascular:     Rate and Rhythm: Normal rate and regular rhythm.     Heart sounds: Normal heart sounds.  Pulmonary:     Effort: Pulmonary effort is normal.     Breath sounds: Normal breath sounds.  Abdominal:     General: Bowel sounds are normal.     Tenderness: There is abdominal tenderness.   Lymphadenopathy:     Cervical: Cervical adenopathy present.  Neurological:     Mental Status: He is alert.  Results for orders placed or performed in visit on 12/14/23  POC COVID-19 BinaxNow  Result Value Ref Range   SARS Coronavirus 2 Ag Negative Negative  POCT Flu A & B Status  Result Value Ref Range   Influenza A, POC Negative Negative   Influenza B, POC Negative Negative  Rapid Strep A  Result Value Ref Range   Rapid Strep A Screen Negative Negative      The ASCVD Risk score (Arnett DK, et al., 2019) failed to calculate for the following reasons:   The 2019 ASCVD risk score is only valid for ages 3 to 90    Assessment & Plan:   Problem List Items Addressed This Visit       Digestive   Diarrhea of infectious origin   Relevant Orders   POC COVID-19 BinaxNow  (Completed)   POCT Flu A & B Status (Completed)   Mononucleosis screen     Nervous and Auditory   Sensation of fullness in right ear   Relevant Medications   fluticasone (FLONASE) 50 MCG/ACT nasal spray     Other   Sore throat   Relevant Orders   POC COVID-19 BinaxNow (Completed)   POCT Flu A & B Status (Completed)   Rapid Strep A (Completed)   Mononucleosis screen   Other headache syndrome   Relevant Medications   fluticasone (FLONASE) 50 MCG/ACT nasal spray   Other Relevant Orders   POC COVID-19 BinaxNow (Completed)   POCT Flu A & B Status (Completed)   Mononucleosis screen   Acute cough   Relevant Orders   POC COVID-19 BinaxNow (Completed)   POCT Flu A & B Status (Completed)   Nausea   Relevant Medications   ondansetron  (ZOFRAN -ODT) 4 MG disintegrating tablet   fluticasone (FLONASE) 50 MCG/ACT nasal spray   Viral illness - Primary   Other Visit Diagnoses       Lower abdominal pain       Relevant Orders   CBC   Comprehensive metabolic panel with GFR   Lipase      Assessment and Plan Assessment & Plan Acute gastroenteritis with diarrhea and abdominal pain Acute diarrhea and lower abdominal pain suggest viral gastroenteritis. - Provide work absence note for today and possibly tomorrow. -check CBC, CMP, and lipase  Acute pharyngitis with sore throat and lymphadenopathy Acute sore throat with lymphadenopathy suggests viral or streptococcal pharyngitis. Awaiting strep test results. - Perform strep test. - Provide work absence note for today and possibly tomorrow if strep test is positive. -strep test negative -symptom treatment with otc analgesics and drinking plenty of fluid  Acute upper respiratory infection with cough, right ear fullness, and headache Cough, ear fullness, and headache suggest viral upper respiratory infection. Awaiting COVID and flu test results. - Perform COVID and flu swabs. - Provide work absence note for today and possibly tomorrow if  COVID or flu tests are positive. -negative flu and covid test -otc analgesics as needed. Flonase for ear fullness. Zofran  for nausea   Return in about 6 months (around 06/13/2024) for CPE and Labs.    Adina Crandall, NP

## 2023-12-18 ENCOUNTER — Ambulatory Visit: Payer: Self-pay | Admitting: Nurse Practitioner

## 2024-01-30 ENCOUNTER — Encounter: Payer: Self-pay | Admitting: Nurse Practitioner

## 2024-02-01 DIAGNOSIS — Z419 Encounter for procedure for purposes other than remedying health state, unspecified: Secondary | ICD-10-CM | POA: Diagnosis not present

## 2024-02-07 ENCOUNTER — Ambulatory Visit: Payer: Self-pay

## 2024-02-07 ENCOUNTER — Emergency Department

## 2024-02-07 ENCOUNTER — Other Ambulatory Visit: Payer: Self-pay

## 2024-02-07 ENCOUNTER — Emergency Department
Admission: EM | Admit: 2024-02-07 | Discharge: 2024-02-07 | Disposition: A | Discharge status: Home / Self Care | Attending: Emergency Medicine | Admitting: Emergency Medicine

## 2024-02-07 DIAGNOSIS — M79672 Pain in left foot: Secondary | ICD-10-CM | POA: Insufficient documentation

## 2024-02-07 MED ORDER — KETOROLAC TROMETHAMINE 30 MG/ML IJ SOLN
30.0000 mg | Freq: Once | INTRAMUSCULAR | Status: AC
  Administered 2024-02-07: 17:00:00 30 mg via INTRAMUSCULAR
  Filled 2024-02-07: qty 1

## 2024-02-07 MED ORDER — IBUPROFEN 800 MG PO TABS: 800.0000 mg | ORAL_TABLET | Freq: Three times a day (TID) | ORAL | 0 refills | Status: AC | PRN

## 2024-02-07 NOTE — Telephone Encounter (Signed)
 FYI Only or Action Required?: FYI only for provider: ED advised.  Patient was last seen in primary care on 12/14/2023 by Wendee Lynwood HERO, NP.  Called Nurse Triage reporting Pain.  Symptoms began today.  Interventions attempted: Nothing.  Symptoms are: unchanged.  Triage Disposition: Go to ED Now (overriding See HCP Within 4 Hours (Or PCP Triage))  Patient/caregiver understands and will follow disposition?:       Copied from CRM #[id number]. Topic: Clinical - Red Word Triage >> Feb 07, 2024 11:46 AM Justin Duran wrote: Red Word that prompted transfer to Nurse Triage:  He woke up with pain in his left foot. His pain level is a 6-7 now but hurts worse when walking. He does not know what happened. No other symptoms. Reason for Disposition  [1] SEVERE pain (e.g., excruciating, unable to do any normal activities) AND [2] not improved after 2 hours of pain medicine  Answer Assessment - Initial Assessment Questions Pt woke up with sudden onset pain in his left foot. He states it is in line with is pinky and pain is on both the top and bottom. When asked if any pain in his calf, he stated more like achilles. He states he is barely able to bear weight and is also having weakness. Given the sudden nature, not able to really bear weight and the weakness, RN advised pt go to the ER. RN offered 911, he states he has someone who can take him.     1. ONSET: When did the pain start?      This morning about 8am  2. LOCATION: Where is the pain located?      Outside left foot  3. PAIN: How bad is the pain?    (Scale 1-10; or mild, moderate, severe)     7 4. WORK OR EXERCISE: Has there been any recent work or exercise that involved this part of the body?      unknown 5. CAUSE: What do you think is causing the leg pain?     unknown 6. OTHER SYMPTOMS: Do you have any other symptoms? (e.g., chest pain, back pain, breathing difficulty, swelling, rash, fever, numbness, weakness)     Mild  swelling and weakness  Protocols used: Leg Pain-A-AH

## 2024-02-07 NOTE — ED Provider Notes (Signed)
 [facility] Emergency Department Provider Note     Event Date/Time   First MD Initiated Contact with Patient 02/07/24 1542     (approximate)   History   Foot Pain   HPI  Justin Duran is a 23 y.o. male with a past medical history of congenital pes cavus presents to the ED with complaint of nontraumatic left foot pain x 2-3 days.  Pain is localized to the lateral/dorsal aspect of patient's foot aswell has the mid sole.  Patient reports he woke up today and was unable to bear foot secondary to pain.  Denies decrease in sensation or motor function.  Reports similar pain to when he had a procedure performed on his right foot a few months ago.  Patient is unable to name the procedure.  Chart reviewed -patient had procedure on 03/17/2022 of a right tongue base wedge osteotomy, division of plantar fascia; muscle and right foot with tendon lengthening; tendon transfer; calcaneal osteotomy     Physical Exam   Triage Vital Signs: ED Triage Vitals  Encounter Vitals Group     BP 02/07/24 1443 127/68     Girls Systolic BP Percentile --      Girls Diastolic BP Percentile --      Boys Systolic BP Percentile --      Boys Diastolic BP Percentile --      Pulse Rate 02/07/24 1443 93     Resp 02/07/24 1443 18     Temp 02/07/24 1441 98 F (36.7 C)     Temp src --      SpO2 02/07/24 1443 100 %     Weight 02/07/24 1443 230 lb (104.3 kg)     Height 02/07/24 1443 5' 9 (1.753 m)     Head Circumference --      Peak Flow --      Pain Score 02/07/24 1443 2     Pain Loc --      Pain Education --      Exclude from Growth Chart --     Most recent vital signs: Vitals:   02/07/24 1441 02/07/24 1443  BP:  127/68  Pulse:  93  Resp:  18  Temp: 98 F (36.7 C)   SpO2:  100%    General Awake, no distress.  HEENT NCAT.  CV:  Good peripheral perfusion.  RESP:  Normal effort.  ABD:  No distention.  Other:  Left foot reveals cavus foot. no other visible  deformity.  Tenderness to palpation over lateral aspects and arch of foot.  No crepitus or step-off noted.  Neurovascular status is intact all throughout.  Good capillary refill.  Pedal pulses palpated 2+ Limited range of motion with plantarflexion secondary to pain.   ED Results / Procedures / Treatments   Labs (all labs ordered are listed, but only abnormal results are displayed) Labs Reviewed - No data to display  RADIOLOGY  I personally viewed and evaluated these images as part of my medical decision making, as well as reviewing the written report by the radiologist.  ED Provider Interpretation: Normal-appearing foot x-ray  DG Foot Complete Left Result Date: 02/07/2024 EXAM: 3 OR MORE VIEW(S) XRAY OF THE LEFT FOOT 02/07/2024 03:04:00 PM COMPARISON: 04/30/2023 CLINICAL HISTORY: pain FINDINGS: BONES AND JOINTS: No acute fracture. No malalignment. SOFT TISSUES: The soft tissues are unremarkable. IMPRESSION: 1. No acute osseous abnormality. Electronically signed by: Morgane Naveau MD 02/07/2024 03:31 PM EST RP Workstation: [id number]  PROCEDURES:  Critical Care performed: No  Procedures   MEDICATIONS ORDERED IN ED: Medications  ketorolac  (TORADOL ) 30 MG/ML injection 30 mg (30 mg Intramuscular Given 02/07/24 1659)     IMPRESSION / MDM / ASSESSMENT AND PLAN / ED COURSE  I reviewed the triage vital signs and the nursing notes.                             Clinical Course as of 02/07/24 1707  Thu Feb 07, 2024  1705 DG Foot Complete Left IMPRESSION: 1. No acute osseous abnormality.   [MH]    Clinical Course User Index [MH] Margrette Monte A, PA-C   23 y.o. male presents to the emergency department for evaluation and treatment of nontraumatic left foot pain. See HPI for further details.   Differential diagnosis includes, but is not limited to fracture, dislocation, strain, tendinitis, plantar fasciitis  Patient's presentation is most consistent with acute complicated  illness / injury requiring diagnostic workup.  Patient is alert and oriented.  He is hemodynamic stable.  Physical exam findings are stated above.  X-ray is reassuring.  Toradol  IM administered for pain.  Patient is in stable condition for outpatient management.  Advised to follow-up with podiatry for further evaluation.  We will place him in a postop shoe.  Crutches were offered however patient reports he has crutches at home.  Advised nonweightbearing status for 1 week with gradual increase and NSAID use for pain at home.  Prescription for ibuprofen  800 mg sent to pharmacy.  Patient stable condition for discharge home.  He is in agreement with this care plan.  ED return precautions discussed.   FINAL CLINICAL IMPRESSION(S) / ED DIAGNOSES   Final diagnoses:  Foot pain, left   Rx / DC Orders   ED Discharge Orders          Ordered    ibuprofen  (ADVIL ) 800 MG tablet  Every 8 hours PRN        02/07/24 1652             Note:  This document was prepared using Dragon voice recognition software and may include unintentional dictation errors.    Margrette, Kadeisha Betsch A, PA-C 02/07/24 [phone]

## 2024-02-07 NOTE — Discharge Instructions (Addendum)
 Your evaluated in the ED for left foot pain.  Your physical exam findings are reassuring.  Presentation is most consistent with plantar fasciitis as your x-rays are normal of any bony abnormality.  We would like for you to follow-up with podiatry for further evaluation.  In the interim please stay in postop shoe with crutches.  Nonweightbearing status for a week with gradual increase as symptoms improve.  You can alternate Tylenol  and ibuprofen  for pain.  Keep foot elevated is much as possible.  Call podiatry in the morning.

## 2024-02-07 NOTE — ED Triage Notes (Signed)
 Pt to ED for waking up with left foot pain. Pt denies pain. Reports looks swollen. Reports worsening pain with ambulation. Reports has had same problem to other foot and had reconstruction surgery

## 2024-02-07 NOTE — Telephone Encounter (Signed)
Noted. Agree with being evaluated

## 2024-02-08 ENCOUNTER — Encounter: Payer: Self-pay | Admitting: Podiatry

## 2024-02-08 ENCOUNTER — Ambulatory Visit: Admitting: Podiatry

## 2024-02-08 DIAGNOSIS — M25572 Pain in left ankle and joints of left foot: Secondary | ICD-10-CM | POA: Diagnosis not present

## 2024-02-08 MED ORDER — TRIAMCINOLONE ACETONIDE 40 MG/ML IJ SUSP
40.0000 mg | Freq: Once | INTRAMUSCULAR | Status: AC
  Administered 2024-02-08: 40 mg

## 2024-02-08 NOTE — Progress Notes (Signed)
 Presents with complaint of pain on the lateral left foot over the dorsal and plantar lateral aspect of the foot.  Began 8 days ago with redness and swelling.  Still having a lot of pain there although not as severe as initially.  Went to the ER yesterday and had some x-rays.  Does not recall any injury to the foot.  No fever chills or nausea or vomiting.   Physical exam:  General appearance: Pleasant, and in no acute distress. AOx3.  Vascular: Pedal pulses: DP 2/4 bilaterally, PT 2 to/4 bilaterally.  Focal edema dorsal lateral foot left. Capillary fill time immediate bilaterally.  Neurological: Light touch intact feet bilaterally.  Normal Achilles reflex bilaterally.  No clonus or spasticity noted.   Dermatologic:   Skin normal temperature bilaterally.  Redness over the fourth fifth met cuboid joint left.    Musculoskeletal: Tenderness at 4th and 5th metatarsal tarsal joints with palpation and range of motion.  Joint swelling palpable in the area.  No other areas of tenderness on the left foot.  Radiographs: Reviewed radiographs left foot taken yesterday in the ER.  No [place] fractures or dislocations.  No joint space narrowing at 4th and 5th tarsometatarsal joints.  Diagnosis: 1.  Arthralgia 4th and 5th tarsometatarsal joints left.  Plan: -Discussed with him pain in the left foot at the 4th and 5th TMT's.  Most likely is an acute gout flare.  He has had these in the past.  Sometimes in the other foot.  Began about a week ago. We will check his uric acid today.  At some point would like to get a joint aspiration so we can confirm diagnosis of gout.  Told if he does have a flare again like this to call us  immediately can we can try to get him in within a day or 2 so we can get a joint aspiration that is a higher chance of having crystals present\ -Order labs uric acid  -injected 3cc 2:1 mixture 0.5 cc Marcaine : Triamcinolone  40mg /53ml at 4th and 5th tarsometatarsal joints left.    Return 2 weeks follow-up injection 4th and 5th TMT left

## 2024-02-22 ENCOUNTER — Ambulatory Visit: Admitting: Podiatry
